# Patient Record
Sex: Female | Born: 1990 | Race: White | Hispanic: No | Marital: Single | Smoking: Former smoker
Health system: Southern US, Community
[De-identification: ages and names within clinical notes are randomized; demographics above are authoritative.]

## PROBLEM LIST (undated history)

## (undated) DIAGNOSIS — F909 Attention-deficit hyperactivity disorder, unspecified type: Secondary | ICD-10-CM

## (undated) DIAGNOSIS — B009 Herpesviral infection, unspecified: Secondary | ICD-10-CM

## (undated) DIAGNOSIS — F1990 Other psychoactive substance use, unspecified, uncomplicated: Secondary | ICD-10-CM

## (undated) HISTORY — DX: Herpesviral infection, unspecified: B00.9

## (undated) HISTORY — DX: Attention-deficit hyperactivity disorder, unspecified type: F90.9

---

## 2012-02-21 HISTORY — PX: WISDOM TOOTH EXTRACTION: SHX21

## 2019-09-11 ENCOUNTER — Ambulatory Visit: Payer: Medicaid Other | Admitting: Family Medicine

## 2019-09-11 ENCOUNTER — Other Ambulatory Visit: Payer: Self-pay

## 2019-09-11 ENCOUNTER — Encounter: Payer: Self-pay | Admitting: Family Medicine

## 2019-09-11 VITALS — BP 105/65 | HR 74 | Temp 97.5°F | Ht 63.5 in | Wt 114.4 lb

## 2019-09-11 DIAGNOSIS — R300 Dysuria: Secondary | ICD-10-CM | POA: Diagnosis not present

## 2019-09-11 DIAGNOSIS — N3001 Acute cystitis with hematuria: Secondary | ICD-10-CM | POA: Diagnosis not present

## 2019-09-11 DIAGNOSIS — F320 Major depressive disorder, single episode, mild: Secondary | ICD-10-CM

## 2019-09-11 DIAGNOSIS — Z309 Encounter for contraceptive management, unspecified: Secondary | ICD-10-CM

## 2019-09-11 DIAGNOSIS — F411 Generalized anxiety disorder: Secondary | ICD-10-CM | POA: Diagnosis not present

## 2019-09-11 HISTORY — DX: Major depressive disorder, single episode, mild: F32.0

## 2019-09-11 LAB — URINALYSIS, COMPLETE
Bilirubin, UA: NEGATIVE
Glucose, UA: NEGATIVE
Ketones, UA: NEGATIVE
Nitrite, UA: NEGATIVE
Protein,UA: NEGATIVE
Specific Gravity, UA: 1.025 (ref 1.005–1.030)
Urobilinogen, Ur: 1 mg/dL (ref 0.2–1.0)
pH, UA: 6 (ref 5.0–7.5)

## 2019-09-11 LAB — MICROSCOPIC EXAMINATION: Renal Epithel, UA: NONE SEEN /hpf

## 2019-09-11 MED ORDER — ESCITALOPRAM OXALATE 10 MG PO TABS: 10.0000 mg | ORAL_TABLET | Freq: Every day | ORAL | 2 refills | Status: DC

## 2019-09-11 MED ORDER — NITROFURANTOIN MONOHYD MACRO 100 MG PO CAPS: 100.0000 mg | ORAL_CAPSULE | Freq: Two times a day (BID) | ORAL | 0 refills | Status: AC

## 2019-09-11 NOTE — Progress Notes (Signed)
New Patient Office Visit  Assessment & Plan:  1. Generalized anxiety disorder - Uncontrolled. Started patient on Lexapro 10 mg QD. Referred for counseling.  - escitalopram (LEXAPRO) 10 MG tablet; Take 1 tablet (10 mg total) by mouth daily.  Dispense: 30 tablet; Refill: 2  2. Current mild episode of major depressive disorder without prior episode (HCC) - Uncontrolled. Started patient on Lexapro 10 mg QD. Referred for counseling.  - escitalopram (LEXAPRO) 10 MG tablet; Take 1 tablet (10 mg total) by mouth daily.  Dispense: 30 tablet; Refill: 2  3. Dysuria - Urinalysis, Complete - Urine dipstick shows positive for RBC's and positive for leukocytes.  Micro exam: 6-10 WBC's per HPF, 3-10 RBC's per HPF and many bacteria. - Microscopic Examination  4. Acute cystitis with hematuria - Urine Culture - nitrofurantoin, macrocrystal-monohydrate, (MACROBID) 100 MG capsule; Take 1 capsule (100 mg total) by mouth 2 (two) times daily for 5 days. 1 po BId  Dispense: 10 capsule; Refill: 0  5. Encounter for contraceptive management, unspecified type - Patient would like Nexplanon. Appointment set up with MD in office to discuss.    Follow-up: Return in 6 weeks (on 10/23/2019) for annual physical & f/u mood.   Deliah Boston, MSN, APRN, FNP-C [facility]  Subjective:  Patient ID: Gabrielle Black, female    DOB: 01-Apr-1990  Age: 29 y.o. MRN: [id number]  Patient Care Team: Gwenlyn Fudge, FNP as PCP - General (Family Medicine)  CC:  Chief Complaint  Patient presents with  . New Patient (Initial Visit)    States just went to [facility].  . Establish Care  . Dysuria    x 1 month  . lower abd pain    Patient states that she has been having lower abd pain x 2 years on and off.  . Depression    x 1 month     HPI Gabrielle Black presents to establish care.   Patient feels she is having trouble with anxiety and depression. She has never been on medication to treat. Never completed any  counseling.   Depression screen [facility] 2/9 09/11/2019  Decreased Interest 2  Down, Depressed, Hopeless 1  PHQ - 2 Score 3  Altered sleeping 0  Tired, decreased energy 2  Change in appetite 0  Feeling bad or failure about yourself  0  Trouble concentrating 3  Moving slowly or fidgety/restless 2  Suicidal thoughts 1  PHQ-9 Score 11    GAD 7 : Generalized Anxiety Score 09/11/2019  Nervous, Anxious, on Edge 2  Control/stop worrying 1  Worry too much - different things 3  Trouble relaxing 1  Restless 2  Easily annoyed or irritable 3  Afraid - awful might happen 0  Total GAD 7 Score 12    Urinary Tract Infection: Patient complains of dysuria and pain in the lower abdomen.  She has had symptoms for greater than 1 month. Patient also complains of back pain. Patient denies fever.   Patient also wants to discuss birth control options. She does not wish to have oral pills, IUD, or vaginal ring. She has done depo shot in the past. She has not had any birth control in the past 10 years.   Review of Systems  Constitutional: Negative for chills, fever, malaise/fatigue and weight loss.  HENT: Negative for congestion, ear discharge, ear pain, nosebleeds, sinus pain, sore throat and tinnitus.   Eyes: Negative for blurred vision, double vision, pain, discharge and redness.  Respiratory: Negative for cough, shortness  of breath and wheezing.   Cardiovascular: Negative for chest pain, palpitations and leg swelling.  Gastrointestinal: Positive for abdominal pain. Negative for constipation, diarrhea, heartburn, nausea and vomiting.  Genitourinary: Negative for dysuria, frequency and urgency.  Musculoskeletal: Positive for back pain. Negative for myalgias.  Skin: Negative for rash.  Neurological: Negative for dizziness, seizures, weakness and headaches.  Psychiatric/Behavioral: Positive for depression. Negative for substance abuse and suicidal ideas. The patient is nervous/anxious.    No current  outpatient medications on file.  No Known Allergies  Past Medical History:  Diagnosis Date  . ADHD   . HSV-2 (herpes simplex virus 2) infection     History reviewed. No pertinent surgical history.  Family History  Adopted: Yes  Problem Relation Age of Onset  . Hypertension Maternal Uncle   . Hyperlipidemia Maternal Uncle     Social History   Socioeconomic History  . Marital status: Single    Spouse name: Not on file  . Number of children: Not on file  . Years of education: Not on file  . Highest education level: Not on file  Occupational History  . Not on file  Tobacco Use  . Smoking status: Current Every Day Smoker    Packs/day: 0.50    Types: Cigarettes  . Smokeless tobacco: Never Used  Vaping Use  . Vaping Use: Never used  Substance and Sexual Activity  . Alcohol use: Yes    Comment: occ  . Drug use: Never  . Sexual activity: Yes    Birth control/protection: None  Other Topics Concern  . Not on file  Social History Narrative  . Not on file   Social Determinants of Health   Financial Resource Strain:   . Difficulty of Paying Living Expenses:   Food Insecurity:   . Worried About Programme researcher, broadcasting/film/video in the Last Year:   . Barista in the Last Year:   Transportation Needs:   . Freight forwarder (Medical):   Marland Kitchen Lack of Transportation (Non-Medical):   Physical Activity:   . Days of Exercise per Week:   . Minutes of Exercise per Session:   Stress:   . Feeling of Stress :   Social Connections:   . Frequency of Communication with Friends and Family:   . Frequency of Social Gatherings with Friends and Family:   . Attends Religious Services:   . Active Member of Clubs or Organizations:   . Attends Banker Meetings:   Marland Kitchen Marital Status:   Intimate Partner Violence:   . Fear of Current or Ex-Partner:   . Emotionally Abused:   Marland Kitchen Physically Abused:   . Sexually Abused:     Objective:   Today's Vitals: BP 105/65   Pulse 74    Temp (!) 97.5 F (36.4 C) (Temporal)   Ht 5' 3.5" (1.613 m)   Wt 114 lb 6.4 oz (51.9 kg)   LMP 08/28/2019   BMI 19.95 kg/m   Physical Exam Vitals reviewed.  Constitutional:      General: She is not in acute distress.    Appearance: Normal appearance. She is underweight. She is not ill-appearing, toxic-appearing or diaphoretic.  HENT:     Head: Normocephalic and atraumatic.  Eyes:     General: No scleral icterus.       Right eye: No discharge.        Left eye: No discharge.     Conjunctiva/sclera: Conjunctivae normal.  Cardiovascular:     Rate and  Rhythm: Normal rate and regular rhythm.     Heart sounds: Normal heart sounds. No murmur heard.  No friction rub. No gallop.   Pulmonary:     Effort: Pulmonary effort is normal. No respiratory distress.     Breath sounds: Normal breath sounds. No stridor. No wheezing, rhonchi or rales.  Musculoskeletal:        General: Normal range of motion.     Cervical back: Normal range of motion.  Skin:    General: Skin is warm and dry.     Capillary Refill: Capillary refill takes less than 2 seconds.  Neurological:     General: No focal deficit present.     Mental Status: She is alert and oriented to person, place, and time. Mental status is at baseline.  Psychiatric:        Mood and Affect: Mood normal.        Behavior: Behavior normal.        Thought Content: Thought content normal.        Judgment: Judgment normal.

## 2019-09-13 ENCOUNTER — Encounter: Payer: Self-pay | Admitting: Family Medicine

## 2019-09-13 LAB — URINE CULTURE

## 2019-09-29 ENCOUNTER — Ambulatory Visit: Payer: Medicaid Other | Admitting: Family Medicine

## 2019-09-30 ENCOUNTER — Encounter: Payer: Self-pay | Admitting: Family Medicine

## 2019-10-23 ENCOUNTER — Other Ambulatory Visit: Payer: Self-pay

## 2019-10-23 ENCOUNTER — Ambulatory Visit: Payer: Medicaid Other | Admitting: Family Medicine

## 2019-10-23 ENCOUNTER — Encounter: Payer: Self-pay | Admitting: Family Medicine

## 2019-10-23 ENCOUNTER — Other Ambulatory Visit (HOSPITAL_COMMUNITY)
Admission: RE | Admit: 2019-10-23 | Discharge: 2019-10-23 | Disposition: A | Discharge status: Home / Self Care | Payer: Medicaid Other | Source: Ambulatory Visit | Attending: Family Medicine | Admitting: Family Medicine

## 2019-10-23 VITALS — BP 116/74 | HR 98 | Temp 98.2°F | Ht 63.5 in | Wt 118.2 lb

## 2019-10-23 DIAGNOSIS — Z114 Encounter for screening for human immunodeficiency virus [HIV]: Secondary | ICD-10-CM

## 2019-10-23 DIAGNOSIS — Z0001 Encounter for general adult medical examination with abnormal findings: Secondary | ICD-10-CM

## 2019-10-23 DIAGNOSIS — F320 Major depressive disorder, single episode, mild: Secondary | ICD-10-CM

## 2019-10-23 DIAGNOSIS — Z113 Encounter for screening for infections with a predominantly sexual mode of transmission: Secondary | ICD-10-CM | POA: Diagnosis present

## 2019-10-23 DIAGNOSIS — Z124 Encounter for screening for malignant neoplasm of cervix: Secondary | ICD-10-CM

## 2019-10-23 DIAGNOSIS — F411 Generalized anxiety disorder: Secondary | ICD-10-CM | POA: Diagnosis not present

## 2019-10-23 DIAGNOSIS — Z0000 Encounter for general adult medical examination without abnormal findings: Secondary | ICD-10-CM

## 2019-10-23 DIAGNOSIS — Z1159 Encounter for screening for other viral diseases: Secondary | ICD-10-CM | POA: Diagnosis not present

## 2019-10-23 NOTE — Progress Notes (Signed)
Assessment & Plan:  1. Well adult exam - Preventive health education provided. Patient declined TDAP, influenza, and COVID-19 vaccines.  - CBC with Differential/Platelet - CMP14+EGFR - Lipid panel  2-3. Generalized anxiety disorder/Current mild episode of major depressive disorder without prior episode ([place]) - Uncontrolled. Patient declines medication. Encouraged to go to [facility] urgent care. Patient has things going on that she does not want disclosed in her medical record.   4. Encounter for hepatitis C screening test for low risk patient - Hepatitis C antibody  5. Encounter for screening for human immunodeficiency virus (HIV) - HIV Antibody (routine testing w rflx)  6. Screening for cervical cancer - Cytology - PAP  7. Routine screening for STI (sexually transmitted infection) - Cytology - PAP   Follow-up: Return in about 1 year (around 10/22/2020) for annual physical.   Hendricks Limes, MSN, APRN, FNP-C [facility]  Subjective:  Patient ID: Gabrielle Black, female    DOB: 1990/10/17  Age: 29 y.o. MRN: [id number]  Patient Care Team: Loman Brooklyn, FNP as PCP - General (Family Medicine)   CC:  Chief Complaint  Patient presents with   Gynecologic Exam    Patient would like help to stop smoking.    Ear Pain    Patient states she thinks she has something in her bilateral ears.   Anxiety    Patient states she stopped her lexapro due to vomiting.    HPI Gabrielle Black presents for her annual physical.   Immunizations: Flu Vaccine: declined Tdap Vaccine: declined  COVID-19 Vaccine: declined  Patient was started on Lexapro ~6 weeks ago for anxiety and depression. She reports she only took the medication twice and both times it made her vomit so she quit taking it. She does not wish to have another medication. She was also referred for therapy and states she has an appointment coming up but is going to have to reschedule for  personal reasons.   Depression screen [facility] 2/9 10/23/2019 09/11/2019  Decreased Interest 2 2  Down, Depressed, Hopeless 3 1  PHQ - 2 Score 5 3  Altered sleeping 0 0  Tired, decreased energy 1 2  Change in appetite 2 0  Feeling bad or failure about yourself  1 0  Trouble concentrating 0 3  Moving slowly or fidgety/restless 3 2  Suicidal thoughts 1 1  PHQ-9 Score 13 11   Patient has had thoughts of harming herself. She does not have a plan and states she would never be able to actually do it as she knows she needs to be here for her family.   GAD 7 : Generalized Anxiety Score 10/23/2019 09/11/2019  Nervous, Anxious, on Edge 1 2  Control/stop worrying 2 1  Worry too much - different things 3 3  Trouble relaxing 1 1  Restless 2 2  Easily annoyed or irritable 3 3  Afraid - awful might happen 0 0  Total GAD 7 Score 12 12    Patient feels she has something in her ears and would like them checked.    Review of Systems  Constitutional: Negative for chills, fever, malaise/fatigue and weight loss.  HENT: Negative for congestion, ear discharge, ear pain, nosebleeds, sinus pain, sore throat and tinnitus.   Eyes: Negative for blurred vision, double vision, pain, discharge and redness.  Respiratory: Negative for cough, shortness of breath and wheezing.   Cardiovascular: Negative for chest pain, palpitations and leg swelling.  Gastrointestinal: Negative for abdominal  pain, constipation, diarrhea, heartburn, nausea and vomiting.  Genitourinary: Negative for dysuria, frequency and urgency.  Musculoskeletal: Negative for myalgias.  Skin: Negative for rash.  Neurological: Negative for dizziness, seizures, weakness and headaches.  Psychiatric/Behavioral: Positive for depression and suicidal ideas. Negative for substance abuse. The patient is nervous/anxious.     No current outpatient medications on file.  No Known Allergies  Past Medical History:  Diagnosis Date   ADHD    HSV-2 (herpes  simplex virus 2) infection     History reviewed. No pertinent surgical history.  Family History  Adopted: Yes  Problem Relation Age of Onset   Hypertension Maternal Uncle    Hyperlipidemia Maternal Uncle     Social History   Socioeconomic History   Marital status: Single    Spouse name: Not on file   Number of children: Not on file   Years of education: Not on file   Highest education level: Not on file  Occupational History   Not on file  Tobacco Use   Smoking status: Current Every Day Smoker    Packs/day: 0.50    Types: Cigarettes   Smokeless tobacco: Never Used  Vaping Use   Vaping Use: Never used  Substance and Sexual Activity   Alcohol use: Yes    Comment: occ   Drug use: Never   Sexual activity: Yes    Birth control/protection: None  Other Topics Concern   Not on file  Social History Narrative   Not on file   Social Determinants of Health   Financial Resource Strain:    Difficulty of Paying Living Expenses: Not on file  Food Insecurity:    Worried About Charity fundraiser in the Last Year: Not on file   [employer] of Food in the Last Year: Not on file  Transportation Needs:    Lack of Transportation (Medical): Not on file   Lack of Transportation (Non-Medical): Not on file  Physical Activity:    Days of Exercise per Week: Not on file   Minutes of Exercise per Session: Not on file  Stress:    Feeling of Stress : Not on file  Social Connections:    Frequency of Communication with Friends and Family: Not on file   Frequency of Social Gatherings with Friends and Family: Not on file   Attends Religious Services: Not on file   Active Member of Clubs or Organizations: Not on file   Attends Archivist Meetings: Not on file   Marital Status: Not on file  Intimate Partner Violence:    Fear of Current or Ex-Partner: Not on file   Emotionally Abused: Not on file   Physically Abused: Not on file   Sexually Abused: Not  on file      Objective:    BP 116/74    Pulse 98    Temp 98.2 F (36.8 C) (Temporal)    Ht 5' 3.5" (1.613 m)    Wt 118 lb 3.2 oz (53.6 kg)    LMP 09/22/2019 (Approximate)    SpO2 98%    BMI 20.61 kg/m   Wt Readings from Last 3 Encounters:  10/23/19 118 lb 3.2 oz (53.6 kg)  09/11/19 114 lb 6.4 oz (51.9 kg)    Physical Exam Vitals reviewed. Exam conducted with a chaperone present.  Constitutional:      General: She is not in acute distress.    Appearance: Normal appearance. She is overweight. She is not ill-appearing, toxic-appearing or diaphoretic.  HENT:  Head: Normocephalic and atraumatic.     Right Ear: Tympanic membrane, ear canal and external ear normal. There is no impacted cerumen.     Left Ear: Tympanic membrane, ear canal and external ear normal. There is no impacted cerumen.     Nose: Nose normal. No congestion or rhinorrhea.     Mouth/Throat:     Mouth: Mucous membranes are moist.     Pharynx: Oropharynx is clear. No oropharyngeal exudate or posterior oropharyngeal erythema.  Eyes:     General: No scleral icterus.       Right eye: No discharge.        Left eye: No discharge.     Conjunctiva/sclera: Conjunctivae normal.     Pupils: Pupils are equal, round, and reactive to light.  Cardiovascular:     Rate and Rhythm: Normal rate and regular rhythm.     Heart sounds: Normal heart sounds. No murmur heard.  No friction rub. No gallop.   Pulmonary:     Effort: Pulmonary effort is normal. No respiratory distress.     Breath sounds: Normal breath sounds. No stridor. No wheezing, rhonchi or rales.  Chest:     Breasts: Breasts are symmetrical.        Right: Normal.        Left: Normal.  Abdominal:     General: Abdomen is flat. Bowel sounds are normal. There is no distension.     Palpations: Abdomen is soft. There is no mass.     Tenderness: There is no abdominal tenderness. There is no guarding or rebound.     Hernia: No hernia is present.  Genitourinary:     Pubic Area: No rash or pubic lice.      Labia:        Right: No rash, tenderness, lesion or injury.        Left: No rash, tenderness, lesion or injury.      Vagina: Normal.     Cervix: Discharge (thin clear) present. No cervical motion tenderness, friability, lesion, erythema, cervical bleeding or eversion.     Uterus: Normal.      Adnexa: Right adnexa normal and left adnexa normal.  Musculoskeletal:        General: Normal range of motion.     Cervical back: Normal range of motion and neck supple. No rigidity. No muscular tenderness.  Lymphadenopathy:     Cervical: No cervical adenopathy.     Upper Body:     Right upper body: No supraclavicular, axillary or pectoral adenopathy.     Left upper body: No supraclavicular, axillary or pectoral adenopathy.  Skin:    General: Skin is warm and dry.     Capillary Refill: Capillary refill takes less than 2 seconds.  Neurological:     General: No focal deficit present.     Mental Status: She is alert and oriented to person, place, and time. Mental status is at baseline.  Psychiatric:        Attention and Perception: Attention and perception normal.        Mood and Affect: Mood is anxious and depressed. Affect is tearful.        Speech: Speech normal.        Behavior: Behavior normal.        Thought Content: Thought content normal.        Cognition and Memory: Cognition and memory normal.        Judgment: Judgment normal.     No results found for:  TSH No results found for: WBC, HGB, HCT, MCV, PLT No results found for: NA, K, CHLORIDE, CO2, GLUCOSE, BUN, CREATININE, BILITOT, ALKPHOS, AST, ALT, PROT, ALBUMIN, CALCIUM, ANIONGAP, EGFR, GFR No results found for: CHOL No results found for: HDL No results found for: LDLCALC No results found for: TRIG No results found for: CHOLHDL No results found for: HGBA1C

## 2019-10-23 NOTE — Patient Instructions (Addendum)
[facility] Phone: [phone] Address: [address], [place] [address]   Preventive Care 28-28 Years Old, Female Preventive care refers to visits with your health care provider and lifestyle choices that can promote health and wellness. This includes:  A yearly physical exam. This may also be called an annual well check.  Regular dental visits and eye exams.  Immunizations.  Screening for certain conditions.  Healthy lifestyle choices, such as eating a healthy diet, getting regular exercise, not using drugs or products that contain nicotine and tobacco, and limiting alcohol use. What can I expect for my preventive care visit? Physical exam Your health care provider will check your:  Height and weight. This may be used to calculate body mass index (BMI), which tells if you are at a healthy weight.  Heart rate and blood pressure.  Skin for abnormal spots. Counseling Your health care provider may ask you questions about your:  Alcohol, tobacco, and drug use.  Emotional well-being.  Home and relationship well-being.  Sexual activity.  Eating habits.  Work and work Statistician.  Method of birth control.  Menstrual cycle.  Pregnancy history. What immunizations do I need?  Influenza (flu) vaccine  This is recommended every year. Tetanus, diphtheria, and pertussis (Tdap) vaccine  You may need a Td booster every 10 years. Varicella (chickenpox) vaccine  You may need this if you have not been vaccinated. Human papillomavirus (HPV) vaccine  If recommended by your health care provider, you may need three doses over 6 months. Measles, mumps, and rubella (MMR) vaccine  You may need at least one dose of MMR. You may also need a second dose. Meningococcal conjugate (MenACWY) vaccine  One dose is recommended if you are age 19-21 years and a first-year college student living in a residence hall, or if you have one of several medical  conditions. You may also need additional booster doses. Pneumococcal conjugate (PCV13) vaccine  You may need this if you have certain conditions and were not previously vaccinated. Pneumococcal polysaccharide (PPSV23) vaccine  You may need one or two doses if you smoke cigarettes or if you have certain conditions. Hepatitis A vaccine  You may need this if you have certain conditions or if you travel or work in places where you may be exposed to hepatitis A. Hepatitis B vaccine  You may need this if you have certain conditions or if you travel or work in places where you may be exposed to hepatitis B. Haemophilus influenzae type b (Hib) vaccine  You may need this if you have certain conditions. You may receive vaccines as individual doses or as more than one vaccine together in one shot (combination vaccines). Talk with your health care provider about the risks and benefits of combination vaccines. What tests do I need?  Blood tests  Lipid and cholesterol levels. These may be checked every 5 years starting at age 49.  Hepatitis C test.  Hepatitis B test. Screening  Diabetes screening. This is done by checking your blood sugar (glucose) after you have not eaten for a while (fasting).  Sexually transmitted disease (STD) testing.  BRCA-related cancer screening. This may be done if you have a family history of breast, ovarian, tubal, or peritoneal cancers.  Pelvic exam and Pap test. This may be done every 3 years starting at age 45. Starting at age 21, this may be done every 5 years if you have a Pap test in combination with an HPV test. Talk with your  health care provider about your test results, treatment options, and if necessary, the need for more tests. Follow these instructions at home: Eating and drinking   Eat a diet that includes fresh fruits and vegetables, whole grains, lean protein, and low-fat dairy.  Take vitamin and mineral supplements as recommended by your health  care provider.  Do not drink alcohol if: ? Your health care provider tells you not to drink. ? You are pregnant, may be pregnant, or are planning to become pregnant.  If you drink alcohol: ? Limit how much you have to 0-1 drink a day. ? Be aware of how much alcohol is in your drink. In the [place] one drink equals one 12 oz bottle of beer (355 mL), one 5 oz glass of wine (148 mL), or one 1 oz glass of hard liquor (44 mL). Lifestyle  Take daily care of your teeth and gums.  Stay active. Exercise for at least 30 minutes on 5 or more days each week.  Do not use any products that contain nicotine or tobacco, such as cigarettes, e-cigarettes, and chewing tobacco. If you need help quitting, ask your health care provider.  If you are sexually active, practice safe sex. Use a condom or other form of birth control (contraception) in order to prevent pregnancy and STIs (sexually transmitted infections). If you plan to become pregnant, see your health care provider for a preconception visit. What's next?  Visit your health care provider once a year for a well check visit.  Ask your health care provider how often you should have your eyes and teeth checked.  Stay up to date on all vaccines. This information is not intended to replace advice given to you by your health care provider. Make sure you discuss any questions you have with your health care provider. Document Revised: 10/18/2017 Document Reviewed: 10/18/2017 Elsevier Patient Education  2020 [employer].

## 2019-10-24 LAB — CBC WITH DIFFERENTIAL/PLATELET
Basophils Absolute: 0 10*3/uL (ref 0.0–0.2)
Basos: 1 %
EOS (ABSOLUTE): 0.6 10*3/uL — ABNORMAL HIGH (ref 0.0–0.4)
Eos: 9 %
Hematocrit: 34.3 % (ref 34.0–46.6)
Hemoglobin: 11.6 g/dL (ref 11.1–15.9)
Immature Grans (Abs): 0 10*3/uL (ref 0.0–0.1)
Immature Granulocytes: 0 %
Lymphocytes Absolute: 2.2 10*3/uL (ref 0.7–3.1)
Lymphs: 31 %
MCH: 32.5 pg (ref 26.6–33.0)
MCHC: 33.8 g/dL (ref 31.5–35.7)
MCV: 96 fL (ref 79–97)
Monocytes Absolute: 0.6 10*3/uL (ref 0.1–0.9)
Monocytes: 9 %
Neutrophils Absolute: 3.5 10*3/uL (ref 1.4–7.0)
Neutrophils: 50 %
Platelets: 239 10*3/uL (ref 150–450)
RBC: 3.57 x10E6/uL — ABNORMAL LOW (ref 3.77–5.28)
RDW: 12.7 % (ref 11.7–15.4)
WBC: 6.9 10*3/uL (ref 3.4–10.8)

## 2019-10-24 LAB — CYTOLOGY - PAP
Chlamydia: NEGATIVE
Comment: NEGATIVE
Comment: NEGATIVE
Comment: NORMAL
Diagnosis: NEGATIVE
Neisseria Gonorrhea: NEGATIVE
Trichomonas: NEGATIVE

## 2019-10-24 LAB — HIV ANTIBODY (ROUTINE TESTING W REFLEX): HIV Screen 4th Generation wRfx: NONREACTIVE

## 2019-10-24 LAB — CMP14+EGFR
ALT: 12 IU/L (ref 0–32)
AST: 11 IU/L (ref 0–40)
Albumin/Globulin Ratio: 2.2 (ref 1.2–2.2)
Albumin: 4.3 g/dL (ref 3.9–5.0)
Alkaline Phosphatase: 48 IU/L (ref 48–121)
BUN/Creatinine Ratio: 7 — ABNORMAL LOW (ref 9–23)
BUN: 4 mg/dL — ABNORMAL LOW (ref 6–20)
Bilirubin Total: 0.4 mg/dL (ref 0.0–1.2)
CO2: 26 mmol/L (ref 20–29)
Calcium: 8.8 mg/dL (ref 8.7–10.2)
Chloride: 104 mmol/L (ref 96–106)
Creatinine, Ser: 0.61 mg/dL (ref 0.57–1.00)
GFR calc Af Amer: 144 mL/min/{1.73_m2} (ref >59)
GFR calc non Af Amer: 125 mL/min/{1.73_m2} (ref >59)
Globulin, Total: 2 g/dL (ref 1.5–4.5)
Glucose: 70 mg/dL (ref 65–99)
Potassium: 3.9 mmol/L (ref 3.5–5.2)
Sodium: 140 mmol/L (ref 134–144)
Total Protein: 6.3 g/dL (ref 6.0–8.5)

## 2019-10-24 LAB — LIPID PANEL
Chol/HDL Ratio: 2.8 ratio (ref 0.0–4.4)
Cholesterol, Total: 125 mg/dL (ref 100–199)
HDL: 44 mg/dL (ref >39)
LDL Chol Calc (NIH): 70 mg/dL (ref 0–99)
Triglycerides: 46 mg/dL (ref 0–149)
VLDL Cholesterol Cal: 11 mg/dL (ref 5–40)

## 2019-10-24 LAB — HEPATITIS C ANTIBODY: Hep C Virus Ab: <0.1 s/co ratio (ref 0.0–0.9)

## 2019-10-26 ENCOUNTER — Encounter: Payer: Self-pay | Admitting: Family Medicine

## 2019-10-28 ENCOUNTER — Ambulatory Visit (HOSPITAL_COMMUNITY): Payer: Self-pay | Admitting: Licensed Clinical Social Worker

## 2019-10-28 ENCOUNTER — Ambulatory Visit (HOSPITAL_COMMUNITY)
Admission: EM | Admit: 2019-10-28 | Discharge: 2019-10-28 | Disposition: A | Discharge status: Home / Self Care | Payer: Medicaid Other

## 2019-11-05 ENCOUNTER — Other Ambulatory Visit: Payer: Self-pay

## 2019-11-05 ENCOUNTER — Encounter: Payer: Self-pay | Admitting: Family Medicine

## 2019-11-05 ENCOUNTER — Ambulatory Visit: Payer: Medicaid Other | Admitting: Family Medicine

## 2019-11-05 VITALS — BP 103/64 | HR 93 | Temp 98.0°F | Ht 63.5 in | Wt 114.0 lb

## 2019-11-05 DIAGNOSIS — Z3046 Encounter for surveillance of implantable subdermal contraceptive: Secondary | ICD-10-CM

## 2019-11-05 DIAGNOSIS — O039 Complete or unspecified spontaneous abortion without complication: Secondary | ICD-10-CM | POA: Diagnosis not present

## 2019-11-05 LAB — PREGNANCY, URINE: Preg Test, Ur: POSITIVE — AB

## 2019-11-05 NOTE — Progress Notes (Signed)
BP 103/64   Pulse 93   Temp 98 F (36.7 C)   Ht 5' 3.5" (1.613 m)   Wt 114 lb (51.7 kg)   SpO2 97%   BMI 19.88 kg/m    Subjective:   Patient ID: Gabrielle Black, female    DOB: [birth year], 29 y.o.   MRN: [id number]  HPI: Gabrielle Black is a 29 y.o. female presenting on 11/05/2019 for Discuss Nexplanon   HPI Patient is coming in for birth control counseling to discuss Nexplanon.  She has had Depo before and has done well.  She just recently had a miscarriage and thinks she lost that 2 weeks ago.  She stopped bleeding 3 days ago and denies any abdominal pain or cramping and feels like things are going well.  She has not been sexually active since that miscarriage.  She does want birth control.  Relevant past medical, surgical, family and social history reviewed and updated as indicated. Interim medical history since our last visit reviewed. Allergies and medications reviewed and updated.  Review of Systems  Constitutional: Negative for chills and fever.  Eyes: Negative for visual disturbance.  Respiratory: Negative for chest tightness and shortness of breath.   Cardiovascular: Negative for chest pain and leg swelling.  Genitourinary: Positive for menstrual problem and pelvic pain. Negative for difficulty urinating, dysuria and urgency.  Musculoskeletal: Negative for back pain and gait problem.  Skin: Negative for rash.  Neurological: Negative for light-headedness and headaches.  Psychiatric/Behavioral: Negative for agitation and behavioral problems.  All other systems reviewed and are negative.   Per HPI unless specifically indicated above   Allergies as of 11/05/2019   No Known Allergies     Medication List    as of November 05, 2019 12:38 PM   You have not been prescribed any medications.      Objective:   BP 103/64   Pulse 93   Temp 98 F (36.7 C)   Ht 5' 3.5" (1.613 m)   Wt 114 lb (51.7 kg)   SpO2 97%   BMI 19.88 kg/m   Wt Readings from Last 3 Encounters:    11/05/19 114 lb (51.7 kg)  10/23/19 118 lb 3.2 oz (53.6 kg)  09/11/19 114 lb 6.4 oz (51.9 kg)    Physical Exam Vitals and nursing note reviewed.  Constitutional:      General: She is not in acute distress.    Appearance: She is well-developed. She is not diaphoretic.  Eyes:     Conjunctiva/sclera: Conjunctivae normal.  Skin:    General: Skin is warm and dry.     Findings: No rash.  Neurological:     Mental Status: She is alert and oriented to person, place, and time.     Coordination: Coordination normal.       Assessment & Plan:   Problem List Items Addressed This Visit    None    Visit Diagnoses    Encounter for surveillance of implantable subdermal contraceptive    -  Primary   Relevant Orders   Pregnancy, urine (Completed)   Beta hCG quant (ref lab)   Miscarriage       Relevant Orders   Beta hCG quant (ref lab)    Patient came back urine pregnancy positive, will do beta-hCG and trended down, likely from miscarriage, she will return in 2 weeks for Nexplanon.  Follow up plan: Return if symptoms worsen or fail to improve, for 2-week recheck.  Counseling provided for all of the vaccine  components Orders Placed This Encounter  Procedures  . Pregnancy, urine  . Beta hCG quant (ref lab)    Arville Care, MD [facility] 11/05/2019, 12:38 PM

## 2019-11-06 LAB — BETA HCG QUANT (REF LAB): hCG Quant: 163 m[IU]/mL

## 2019-11-21 ENCOUNTER — Ambulatory Visit: Payer: Medicaid Other | Admitting: Family Medicine

## 2019-11-21 ENCOUNTER — Telehealth: Payer: Self-pay

## 2019-11-21 NOTE — Telephone Encounter (Signed)
Gabrielle Black, forwarding this message to you so you can call patient to schedule appointment.  I know Dr. Louanne Skye likes them to come in at certain times during their cycle.

## 2019-11-21 NOTE — Telephone Encounter (Signed)
Pt r/s to 12/11/19 for Nexplanon insertion. Pt will come by the office on 11/27/19 for urine pregnancy. She knows to be abstinent until her appt on 10/21.

## 2019-11-21 NOTE — Telephone Encounter (Signed)
Pt called to cancel her Nexplanon Insertion apt. She is Gabrielle Black pt but was scheduled to see Dettinger. She is aware a nurse will call back to reschedule apt.

## 2019-12-04 ENCOUNTER — Ambulatory Visit (HOSPITAL_COMMUNITY): Payer: Self-pay | Admitting: Licensed Clinical Social Worker

## 2019-12-11 ENCOUNTER — Encounter: Payer: Self-pay | Admitting: Family Medicine

## 2019-12-11 ENCOUNTER — Ambulatory Visit: Payer: Medicaid Other | Admitting: Family Medicine

## 2019-12-11 ENCOUNTER — Other Ambulatory Visit: Payer: Self-pay

## 2019-12-11 VITALS — Ht 63.5 in | Wt 117.0 lb

## 2019-12-11 DIAGNOSIS — Z30017 Encounter for initial prescription of implantable subdermal contraceptive: Secondary | ICD-10-CM

## 2019-12-11 DIAGNOSIS — Z308 Encounter for other contraceptive management: Secondary | ICD-10-CM

## 2019-12-11 LAB — URINALYSIS, COMPLETE
Bilirubin, UA: NEGATIVE
Glucose, UA: NEGATIVE
Ketones, UA: NEGATIVE
Nitrite, UA: NEGATIVE
Protein,UA: NEGATIVE
Specific Gravity, UA: 1.015 (ref 1.005–1.030)
Urobilinogen, Ur: 0.2 mg/dL (ref 0.2–1.0)
pH, UA: 7 (ref 5.0–7.5)

## 2019-12-11 LAB — MICROSCOPIC EXAMINATION
Bacteria, UA: NONE SEEN
RBC, Urine: NONE SEEN /hpf (ref 0–2)

## 2019-12-11 LAB — PREGNANCY, URINE: Preg Test, Ur: NEGATIVE

## 2019-12-11 MED ORDER — ETONOGESTREL 68 MG ~~LOC~~ IMPL: 68.0000 mg | DRUG_IMPLANT | Freq: Once | SUBCUTANEOUS | Status: AC

## 2019-12-11 NOTE — Progress Notes (Signed)
Ht 5' 3.5" (1.613 m)   Wt 117 lb (53.1 kg)   BMI 20.40 kg/m    Subjective:   Patient ID: Gabrielle Black, female    DOB: [birth year], 29 y.o.   MRN: [id number]  HPI: Gabrielle Black is a 29 y.o. female presenting on 12/11/2019 for Nexplanon Insertion   HPI Has not had intercourse for 2 weeks, she had a.  On 10 October and has not had intercourse since before that time.  She had a miscarriage just over a month ago.  And she had a normal cycle just a couple weeks ago since that.  She has not been sexually active since that time.  She has a urine pregnancy today.  She does want a Nexplanon.  Relevant past medical, surgical, family and social history reviewed and updated as indicated. Interim medical history since our last visit reviewed. Allergies and medications reviewed and updated.  Review of Systems  Constitutional: Negative for chills and fever.  Eyes: Negative for visual disturbance.  Respiratory: Negative for chest tightness and shortness of breath.   Cardiovascular: Negative for chest pain and leg swelling.  Genitourinary: Negative for difficulty urinating, dysuria, menstrual problem and urgency.  Musculoskeletal: Negative for back pain and gait problem.  Skin: Negative for rash.  Neurological: Negative for light-headedness and headaches.  Psychiatric/Behavioral: Negative for agitation and behavioral problems.  All other systems reviewed and are negative.   Per HPI unless specifically indicated above   Allergies as of 12/11/2019   No Known Allergies     Medication List    as of December 11, 2019  3:56 PM   You have not been prescribed any medications.      Objective:   Ht 5' 3.5" (1.613 m)   Wt 117 lb (53.1 kg)   BMI 20.40 kg/m   Wt Readings from Last 3 Encounters:  12/11/19 117 lb (53.1 kg)  11/05/19 114 lb (51.7 kg)  10/23/19 118 lb 3.2 oz (53.6 kg)    Physical Exam Vitals and nursing note reviewed.  Constitutional:      General: She is not in acute  distress.    Appearance: She is well-developed. She is not diaphoretic.  Skin:    General: Skin is warm and dry.     Findings: No rash.  Neurological:     Mental Status: She is alert and oriented to person, place, and time.     Coordination: Coordination normal.  Psychiatric:        Behavior: Behavior normal.    Nexplanon insertion: Patient educated on Nexplanon birth control and its side effects and the side effects from the procedure. Patient wishes to continue. Left inner arm prepped with Betadine. 3 mL of 2% lidocaine without epinephrine used for anesthesia. Device was prepped using sterile procedure. Nexplanon was inserted using package and Company directions. Steri-Strips were applied. Bleeding was minimal and patient tolerated procedure well.   Urine Pregnancy negative  Assessment & Plan:   Problem List Items Addressed This Visit    None    Visit Diagnoses    Encounter for other contraceptive management    -  Primary   Relevant Medications   etonogestrel (NEXPLANON) implant 68 mg (Start on 12/11/2019  4:30 PM)   Other Relevant Orders   Pregnancy, urine   Urinalysis, Complete       Follow up plan: Return if symptoms worsen or fail to improve.  Counseling provided for all of the vaccine components Orders Placed This Encounter  Procedures  .  Pregnancy, urine    Arville Care, MD [facility] 12/11/2019, 3:56 PM

## 2019-12-16 ENCOUNTER — Ambulatory Visit (INDEPENDENT_AMBULATORY_CARE_PROVIDER_SITE_OTHER): Payer: Medicaid Other | Admitting: Family

## 2019-12-16 ENCOUNTER — Other Ambulatory Visit: Payer: Self-pay

## 2019-12-16 ENCOUNTER — Encounter: Payer: Self-pay | Admitting: Family

## 2019-12-16 ENCOUNTER — Other Ambulatory Visit: Payer: Self-pay | Admitting: Family

## 2019-12-16 DIAGNOSIS — R399 Unspecified symptoms and signs involving the genitourinary system: Secondary | ICD-10-CM | POA: Diagnosis not present

## 2019-12-16 DIAGNOSIS — R300 Dysuria: Secondary | ICD-10-CM

## 2019-12-16 LAB — URINALYSIS, COMPLETE
Bilirubin, UA: NEGATIVE
Glucose, UA: NEGATIVE
Ketones, UA: NEGATIVE
Nitrite, UA: NEGATIVE
Protein,UA: NEGATIVE
Specific Gravity, UA: <1.005 — ABNORMAL LOW (ref 1.005–1.030)
Urobilinogen, Ur: 0.2 mg/dL (ref 0.2–1.0)
pH, UA: 5 (ref 5.0–7.5)

## 2019-12-16 LAB — MICROSCOPIC EXAMINATION
Epithelial Cells (non renal): NONE SEEN /hpf (ref 0–10)
RBC, Urine: NONE SEEN /hpf (ref 0–2)

## 2019-12-16 MED ORDER — CEPHALEXIN 500 MG PO CAPS: 500.0000 mg | ORAL_CAPSULE | Freq: Two times a day (BID) | ORAL | 0 refills | Status: DC

## 2019-12-16 NOTE — Progress Notes (Signed)
° °  Virtual Visit via telephone Note Due to COVID-19 pandemic this visit was conducted virtually. This visit type was conducted due to national recommendations for restrictions regarding the COVID-19 Pandemic (e.g. social distancing, sheltering in place) in an effort to limit this patient's exposure and mitigate transmission in our community. All issues noted in this document were discussed and addressed.  A physical exam was not performed with this format.  I connected with Gabrielle Black on 12/16/19 at 2:28 pm  by telephone and verified that I am speaking with the correct person using two identifiers. Gabrielle Black is currently located at home and no one is currently with her during visit. The provider, Jannifer Rodney, FNP is located in their office at time of visit.  I discussed the limitations, risks, security and privacy concerns of performing an evaluation and management service by telephone and the availability of in person appointments. I also discussed with the patient that there may be a patient responsible charge related to this service. The patient expressed understanding and agreed to proceed.   History and Present Illness:  Dysuria  This is a new problem. The current episode started 1 to 4 weeks ago. The problem occurs intermittently. The problem has been gradually worsening. The quality of the pain is described as burning. The pain is at a severity of 6/10. The pain is mild. There has been no fever. Associated symptoms include flank pain, frequency and urgency. Pertinent negatives include no chills, discharge, hematuria, hesitancy, nausea or vomiting. She has tried increased fluids for the symptoms. The treatment provided no relief.      Review of Systems  Constitutional: Negative for chills.  Gastrointestinal: Negative for nausea and vomiting.  Genitourinary: Positive for dysuria, flank pain, frequency and urgency. Negative for hematuria and hesitancy.  All other systems reviewed and are  negative.    Observations/Objective: No SOB or distress noted   Assessment and Plan: 1. Dysuria - Urinalysis, Complete - Urine Culture  2. UTI symptoms Force fluids AZO over the counter X2 days RTO if symptoms worsen or do not improve  Culture pending - cephALEXin (KEFLEX) 500 MG capsule; Take 1 capsule (500 mg total) by mouth 2 (two) times daily.  Dispense: 14 capsule; Refill: 0     I discussed the assessment and treatment plan with the patient. The patient was provided an opportunity to ask questions and all were answered. The patient agreed with the plan and demonstrated an understanding of the instructions.   The patient was advised to call back or seek an in-person evaluation if the symptoms worsen or if the condition fails to improve as anticipated.  The above assessment and management plan was discussed with the patient. The patient verbalized understanding of and has agreed to the management plan. Patient is aware to call the clinic if symptoms persist or worsen. Patient is aware when to return to the clinic for a follow-up visit. Patient educated on when it is appropriate to go to the emergency department.   Time call ended:  2:39 pm   I provided 11 minutes of non-face-to-face time during this encounter.    Jannifer Rodney, FNP

## 2019-12-20 LAB — URINE CULTURE

## 2020-03-04 ENCOUNTER — Encounter: Payer: Self-pay | Admitting: Family Medicine

## 2020-03-19 ENCOUNTER — Encounter: Payer: Self-pay | Admitting: Family Medicine

## 2020-03-25 ENCOUNTER — Ambulatory Visit (INDEPENDENT_AMBULATORY_CARE_PROVIDER_SITE_OTHER): Payer: Medicaid Other | Admitting: Family Medicine

## 2020-03-25 ENCOUNTER — Other Ambulatory Visit: Payer: Self-pay

## 2020-03-25 ENCOUNTER — Ambulatory Visit (INDEPENDENT_AMBULATORY_CARE_PROVIDER_SITE_OTHER): Payer: Medicaid Other

## 2020-03-25 ENCOUNTER — Encounter: Payer: Self-pay | Admitting: Family Medicine

## 2020-03-25 VITALS — BP 106/97 | HR 82 | Temp 97.7°F | Ht 63.5 in | Wt 120.0 lb

## 2020-03-25 DIAGNOSIS — G8929 Other chronic pain: Secondary | ICD-10-CM

## 2020-03-25 DIAGNOSIS — M545 Low back pain, unspecified: Secondary | ICD-10-CM | POA: Diagnosis not present

## 2020-03-25 DIAGNOSIS — F909 Attention-deficit hyperactivity disorder, unspecified type: Secondary | ICD-10-CM | POA: Diagnosis not present

## 2020-03-25 DIAGNOSIS — M546 Pain in thoracic spine: Secondary | ICD-10-CM

## 2020-03-25 MED ORDER — MELOXICAM 7.5 MG PO TABS: 7.5000 mg | ORAL_TABLET | Freq: Every day | ORAL | 2 refills | Status: DC

## 2020-03-25 MED ORDER — METHOCARBAMOL 500 MG PO TABS: 500.0000 mg | ORAL_TABLET | Freq: Three times a day (TID) | ORAL | 2 refills | Status: DC | PRN

## 2020-03-25 MED ORDER — ATOMOXETINE HCL 40 MG PO CAPS: 40.0000 mg | ORAL_CAPSULE | Freq: Every day | ORAL | 2 refills | Status: DC

## 2020-03-25 NOTE — Progress Notes (Signed)
Assessment & Plan:  1. Chronic midline low back pain without sciatica - Encouraged use of heating pad, muscle rub, NSAID and muscle relaxer.  Patient will start physical therapy and follow-up in 6 weeks. - Ambulatory referral to Physical Therapy - DG Lumbar Spine 2-3 Views - meloxicam (MOBIC) 7.5 MG tablet; Take 1 tablet (7.5 mg total) by mouth daily.  Dispense: 30 tablet; Refill: 2 - methocarbamol (ROBAXIN) 500 MG tablet; Take 1 tablet (500 mg total) by mouth every 8 (eight) hours as needed for muscle spasms.  Dispense: 30 tablet; Refill: 2  2. Acute bilateral thoracic back pain - Encouraged use of heating pad, muscle rub, NSAID and muscle relaxer.  Patient will start physical therapy and follow-up in 6 weeks. - Ambulatory referral to Physical Therapy - DG Thoracic Spine 2 View - meloxicam (MOBIC) 7.5 MG tablet; Take 1 tablet (7.5 mg total) by mouth daily.  Dispense: 30 tablet; Refill: 2 - methocarbamol (ROBAXIN) 500 MG tablet; Take 1 tablet (500 mg total) by mouth every 8 (eight) hours as needed for muscle spasms.  Dispense: 30 tablet; Refill: 2  3. Attention deficit hyperactivity disorder (ADHD), unspecified ADHD type - Uncontrolled.  Patient would like to go ahead and start Strattera while she is waiting to get an appointment with psychiatry. - atomoxetine (STRATTERA) 40 MG capsule; Take 1 capsule (40 mg total) by mouth daily.  Dispense: 30 capsule; Refill: 2 - Ambulatory referral to Psychiatry   Return in about 6 weeks (around 05/06/2020) for back pain & ADHD.  Deliah Boston, MSN, APRN, FNP-C [facility]  Subjective:    Patient ID: Gabrielle Black, female    DOB: [birth year], 30 y.o.   MRN: [id number]  Patient Care Team: Gwenlyn Fudge, FNP as PCP - General (Family Medicine)   Chief Complaint:  Chief Complaint  Patient presents with  . Back Pain    Upper and lower- chronic has gotten worse  . ADHD    Back on medication- pt has been off for 10 years     HPI: Gabrielle Black is a 30 y.o. female presenting on 03/25/2020 for Back Pain (Upper and lower- chronic has gotten worse) and ADHD (Back on medication- pt has been off for 10 years)  Back pain: Patient reports she has had pain in her lower back that she describes as an aching and rates 8 out of 10 on average for probably the last 15 years.  She reports it takes her at least an hour to get out of the bed in the mornings due to the pain.  It is worse with bending.  Heating pad is somewhat helpful.  Tylenol and ibuprofen do not help.  She has never tried any prescription medications.  She has never had any x-rays or done physical therapy.  She also recently started having pain in her upper back that she describes more as a tense pain that she rates a 4.5 out of 10.  ADHD: Patient reports she was diagnosed by her pediatrician with ADHD when she was 30 or 30 years of age.  She has been off of medication for 10 years now.  She reports that she just finds that she forgets everything, is unorganized, cannot focus on conversation or reading, is fidgety, and just often does not know what to do next in her day.  Depression screen [facility] 2/9 03/25/2020 12/11/2019 11/05/2019  Decreased Interest 3 2 2   Down, Depressed, Hopeless 1 3 3   PHQ - 2 Score 4 5  5  Altered sleeping 3 - -  Tired, decreased energy 2 - -  Change in appetite 0 - -  Feeling bad or failure about yourself  1 - -  Trouble concentrating 3 - -  Moving slowly or fidgety/restless 3 - -  Suicidal thoughts 0 - -  PHQ-9 Score 16 - -  Difficult doing work/chores Somewhat difficult - -   GAD 7 : Generalized Anxiety Score 03/25/2020 10/23/2019 09/11/2019  Nervous, Anxious, on Edge 1 1 2   Control/stop worrying 0 2 1  Worry too much - different things 3 3 3   Trouble relaxing 3 1 1   Restless 3 2 2   Easily annoyed or irritable 2 3 3   Afraid - awful might happen 0 0 0  Total GAD 7 Score 12 12 12   Anxiety Difficulty Very difficult - -    New  complaints: None  Social history:  Relevant past medical, surgical, family and social history reviewed and updated as indicated. Interim medical history since our last visit reviewed.  Allergies and medications reviewed and updated.  DATA REVIEWED: CHART IN EPIC  ROS: Negative unless specifically indicated above in HPI.   No current outpatient medications on file.  Current Facility-Administered Medications:  .  etonogestrel (NEXPLANON) implant 68 mg, 68 mg, Subdermal, Once, Dettinger, , MD   No Known Allergies Past Medical History:  Diagnosis Date  . ADHD   . HSV-2 (herpes simplex virus 2) infection     History reviewed. No pertinent surgical history.  Social History   Socioeconomic History  . Marital status: Single    Spouse name: Not on file  . Number of children: Not on file  . Years of education: Not on file  . Highest education level: Not on file  Occupational History  . Not on file  Tobacco Use  . Smoking status: Current Every Day Smoker    Packs/day: 0.50    Types: Cigarettes  . Smokeless tobacco: Never Used  Vaping Use  . Vaping Use: Never used  Substance and Sexual Activity  . Alcohol use: Yes    Comment: occ  . Drug use: Never  . Sexual activity: Yes    Birth control/protection: None  Other Topics Concern  . Not on file  Social History Narrative  . Not on file   Social Determinants of Health   Financial Resource Strain: Not on file  Food Insecurity: Not on file  Transportation Needs: Not on file  Physical Activity: Not on file  Stress: Not on file  Social Connections: Not on file  Intimate Partner Violence: Not on file        Objective:    BP (!) 106/97   Pulse 82   Temp 97.7 F (36.5 C) (Temporal)   Ht 5' 3.5" (1.613 m)   Wt 120 lb (54.4 kg)   SpO2 100%   BMI 20.92 kg/m   Wt Readings from Last 3 Encounters:  03/25/20 120 lb (54.4 kg)  12/11/19 117 lb (53.1 kg)  11/05/19 114 lb (51.7 kg)    Physical Exam Vitals  reviewed.  Constitutional:      General: She is not in acute distress.    Appearance: Normal appearance. She is not ill-appearing, toxic-appearing or diaphoretic.  HENT:     Head: Normocephalic and atraumatic.  Eyes:     General: No scleral icterus.       Right eye: No discharge.        Left eye: No discharge.  Conjunctiva/sclera: Conjunctivae normal.  Cardiovascular:     Rate and Rhythm: Normal rate.  Pulmonary:     Effort: Pulmonary effort is normal. No respiratory distress.  Musculoskeletal:        General: Normal range of motion.     Cervical back: Normal and normal range of motion.     Thoracic back: Bony tenderness present. No swelling, edema, deformity, signs of trauma, lacerations, spasms or tenderness. Normal range of motion. No scoliosis.     Lumbar back: Tenderness present. No swelling, edema, deformity, signs of trauma, lacerations, spasms or bony tenderness. Normal range of motion. No scoliosis.  Skin:    General: Skin is warm and dry.     Capillary Refill: Capillary refill takes less than 2 seconds.  Neurological:     General: No focal deficit present.     Mental Status: She is alert and oriented to person, place, and time. Mental status is at baseline.  Psychiatric:        Mood and Affect: Mood normal.        Behavior: Behavior normal.        Thought Content: Thought content normal.        Judgment: Judgment normal.     No results found for: TSH Lab Results  Component Value Date   WBC 6.9 10/23/2019   HGB 11.6 10/23/2019   HCT 34.3 10/23/2019   MCV 96 10/23/2019   PLT 239 10/23/2019   Lab Results  Component Value Date   NA 140 10/23/2019   K 3.9 10/23/2019   CO2 26 10/23/2019   GLUCOSE 70 10/23/2019   BUN 4 (L) 10/23/2019   CREATININE 0.61 10/23/2019   BILITOT 0.4 10/23/2019   ALKPHOS 48 10/23/2019   AST 11 10/23/2019   ALT 12 10/23/2019   PROT 6.3 10/23/2019   ALBUMIN 4.3 10/23/2019   CALCIUM 8.8 10/23/2019   Lab Results  Component Value  Date   CHOL 125 10/23/2019   Lab Results  Component Value Date   HDL 44 10/23/2019   Lab Results  Component Value Date   LDLCALC 70 10/23/2019   Lab Results  Component Value Date   TRIG 46 10/23/2019   Lab Results  Component Value Date   CHOLHDL 2.8 10/23/2019   No results found for: HGBA1C

## 2020-03-25 NOTE — Patient Instructions (Signed)
Heating pad Muscle rubs NSAID Muscle relaxer Physical therapy

## 2020-03-30 ENCOUNTER — Other Ambulatory Visit: Payer: Self-pay

## 2020-03-30 ENCOUNTER — Encounter: Payer: Self-pay | Admitting: Physical Therapy

## 2020-03-30 ENCOUNTER — Ambulatory Visit: Payer: Medicaid Other | Attending: Family Medicine | Admitting: Physical Therapy

## 2020-03-30 DIAGNOSIS — M6281 Muscle weakness (generalized): Secondary | ICD-10-CM | POA: Diagnosis present

## 2020-03-30 DIAGNOSIS — R293 Abnormal posture: Secondary | ICD-10-CM | POA: Diagnosis present

## 2020-03-30 DIAGNOSIS — M546 Pain in thoracic spine: Secondary | ICD-10-CM | POA: Diagnosis present

## 2020-03-30 DIAGNOSIS — G8929 Other chronic pain: Secondary | ICD-10-CM | POA: Diagnosis present

## 2020-03-30 DIAGNOSIS — M5441 Lumbago with sciatica, right side: Secondary | ICD-10-CM | POA: Insufficient documentation

## 2020-03-30 NOTE — Therapy (Signed)
[facility] [address] [place], [place], [address] Phone: [phone]   Fax:  [phone]  Physical Therapy Evaluation  Patient Details  Name: Gabrielle Black MRN: [id number] Date of Birth: [birth year] Referring Provider (PT): Deliah Boston, FNP   Encounter Date: 03/30/2020   PT End of Session - 03/30/20 1249    Visit Number 1    Number of Visits 12    Date for PT Re-Evaluation 05/18/20    Authorization Type [employer]; Progress note every 10th visit    PT Start Time 1117    PT Stop Time 1156    PT Time Calculation (min) 39 min    Activity Tolerance Patient tolerated treatment well    Behavior During Therapy [facility] for tasks assessed/performed           Past Medical History:  Diagnosis Date  . ADHD   . HSV-2 (herpes simplex virus 2) infection     History reviewed. No pertinent surgical history.  There were no vitals filed for this visit.    Subjective Assessment - 03/30/20 1302    Subjective COVID-19 screening performed upon arrival. Patient arrived to physical therapy with reports of chronic low back pain and thoracic pain that has progressively worsened over the past 13 years. Patient reports pain with daily activities particularly with heavy lifting, and coming up from a forward flexed position. Patient reports intermittent neurological symptoms down to right achilles. Patient reports lying on her stomach is the worst position. Patient pain at worst rated as 7/10 and pain at best as 2/10 with rest and a hot bath. Patient's goals are to decrease pain, improve movement, improve strength, and improve ability to perform home activities, ADLs, and recreational activities.    Limitations House hold activities;Standing;Walking;Sitting    Diagnostic tests x-ray: Normal results    Currently in Pain? Yes    Pain Score 6     Pain Location Back    Pain Orientation Lower;Upper    Pain Descriptors / Indicators Sore    Pain Type Chronic pain     Pain Radiating Towards Right achilles    Pain Onset More than a month ago    Pain Frequency Constant    Aggravating Factors  heavy lifting, coming up to standing from flexed postion    Pain Relieving Factors massage, heat, on medication but unsure of its effects    Effect of Pain on Daily Activities "hard to get out of bed, bend over, and laundry"              [facility] PT Assessment - 03/30/20 0001      Assessment   Medical Diagnosis Chronic midline low back pain without sciatica, acute bilateral throacic back pain    Referring Provider (PT) Deliah Boston, FNP    Onset Date/Surgical Date --   chronic 13 years for low back, 2 years for thoracic   Next MD Visit "to be scheduled"    Prior Therapy no      Precautions   Precautions None      Restrictions   Weight Bearing Restrictions No      Balance Screen   Has the patient fallen in the past 6 months No    Has the patient had a decrease in activity level because of a fear of falling?  No    Is the patient reluctant to leave their home because of a fear of falling?  No      Home Environment   Living Environment  Private residence    Living Arrangements Children;Spouse/significant other      Prior Function   Level of Independence Independent   but with pain     Posture/Postural Control   Posture/Postural Control Postural limitations    Postural Limitations Rounded Shoulders;Forward head;Increased lumbar lordosis;Decreased thoracic kyphosis;Anterior pelvic tilt   bilateral scapular winging     Deep Tendon Reflexes   DTR Assessment Site Patella    Patella DTR 2+   bilaterally     ROM / Strength   AROM / PROM / Strength AROM;Strength      AROM   AROM Assessment Site Lumbar    Lumbar Flexion finger tips to floor    Lumbar Extension 20 degreess    Lumbar - Right Side Bend 20.5" finger tip to floor    Lumbar - Left Side Bend 20.5" finger tip to floor   with right sided pain     Strength   Overall Strength Deficits     Strength Assessment Site Hip;Knee    Right/Left Hip Right;Left    Right Hip Flexion 4/5    Right Hip Extension 3+/5    Right Hip ABduction 3+/5    Left Hip Flexion 4/5    Left Hip Extension 3+/5    Left Hip ABduction 3+/5    Right/Left Knee Left;Right    Right Knee Flexion 4+/5    Right Knee Extension 4+/5    Left Knee Flexion 4+/5    Left Knee Extension 4+/5      Palpation   Spinal mobility WNL PA thoracic mobilization, decreased PA lumbar mobilization    Palpation comment increased tone to bilateral lumbar paraspinals and QL R>L; tenderness to PSIS and upper glute region      Ambulation/Gait   Gait Pattern Within Functional Limits                      Objective measurements completed on examination: See above findings.               PT Education - 03/30/20 1218    Education Details draw in, bridging, pelvic tilts in supine and sitting, rows with yellow theraband    Person(s) Educated Patient    Methods Explanation;Demonstration;Handout    Comprehension Verbalized understanding;Returned demonstration            PT Short Term Goals - 03/30/20 1252      PT SHORT TERM GOAL #1   Title Patient will be independent with intial HEP    Baseline no knowledge of HEP    Time 3    Period Weeks    Status New      PT SHORT TERM GOAL #2   Title Patient will report a 10% reduction of low back pain and thoracic pain with ADLs and home tasks.    Time 3    Period Weeks    Status New             PT Long Term Goals - 03/30/20 1257      PT LONG TERM GOAL #1   Title Patient will be independent with advanced HEP    Time 6    Period Weeks    Status New      PT LONG TERM GOAL #2   Title Patient will demontrate 4+/5 or greater bilateral LE MMT to improve stability during functional tasks.    Baseline 3+/5 to 4+/5 LE MMT    Time 6    Period Weeks  Status New      PT LONG TERM GOAL #3   Title Patient will report a centralization of right LE  neurological symptoms or an elimination of neurological symptoms down right LE to indicate reduced nerve irritation    Baseline down to R achilles intermittently    Time 6    Period Weeks    Status New      PT LONG TERM GOAL #4   Title Patient will report ability to peform ADLs and home tasks with low back and thoracic pain less than or equal to 3/10    Baseline 6/10 at worst    Time 6    Period Weeks    Status New                  Plan - 03/30/20 1956    Clinical Impression Statement Patient is a 30 year old female who presents to physical therapy with chronic low back pain and thoracic pain that began about 13 years and 2 years ago, respectively. Patient noted with [facility] thoracolumbar ROM and decreased bilateral LE MMT. Patella DTR WFL bilaterally. Patient (+) with R slump and SLR test. Patient and PT discussed plan of care and discussed HEP to which patient reported understanding. Patient would benefit from skilled physical therapy to address deficits and patient's goals.    Personal Factors and Comorbidities Age;Time since onset of injury/illness/exacerbation;Comorbidity 1    Comorbidities ADHD    Examination-Activity Limitations Locomotion Level;Bed Mobility;Stairs;Stand    Examination-Participation Restrictions Cleaning;Meal Prep    Stability/Clinical Decision Making Stable/Uncomplicated    Clinical Decision Making Low    Rehab Potential Fair    PT Frequency 2x / week    PT Duration 6 weeks    PT Treatment/Interventions ADLs/Self Care Home Management;Gait training;Stair training;Functional mobility training;Therapeutic activities;Therapeutic exercise;Balance training;Neuromuscular re-education;Manual techniques;Passive range of motion;Patient/family education    PT Next Visit Plan Nustep, UBE, postural exercises, core stabilization and LE strengthening, (healthy blue: no e-stim, ultrasound, traction, vaso)    PT Home Exercise Plan see patient education section    Consulted  and Agree with Plan of Care Patient           Patient will benefit from skilled therapeutic intervention in order to improve the following deficits and impairments:  Decreased activity tolerance,Decreased range of motion,Pain,Postural dysfunction,Decreased strength  Visit Diagnosis: Chronic bilateral low back pain with right-sided sciatica - Plan: PT plan of care cert/re-cert  Pain in thoracic spine - Plan: PT plan of care cert/re-cert  Abnormal posture - Plan: PT plan of care cert/re-cert  Muscle weakness (generalized) - Plan: PT plan of care cert/re-cert     Problem List Patient Active Problem List   Diagnosis Date Noted  . Current mild episode of major depressive disorder without prior episode (HCC) 09/11/2019  . Generalized anxiety disorder 09/11/2019    Guss Bunde, PT, DPT 03/30/2020, 8:08 PM  [facility] [address] [place], [place], [address] Phone: [phone]   Fax:  [phone]  Name: Birda Didonato MRN: [id number] Date of Birth: [birth year]

## 2020-04-22 ENCOUNTER — Telehealth: Payer: Self-pay | Admitting: *Deleted

## 2020-04-22 NOTE — Telephone Encounter (Signed)
Transition Care Management Unsuccessful Follow-up Telephone Call  Date of discharge and from where:  04/21/2020 - [facility] ED  Attempts:  1st Attempt  Reason for unsuccessful TCM follow-up call:  Unable to leave message

## 2020-04-23 NOTE — Telephone Encounter (Signed)
Transition Care Management Unsuccessful Follow-up Telephone Call  Date of discharge and from where:  04/21/2020 - [facility] ED  Attempts:  2nd Attempt  Reason for unsuccessful TCM follow-up call:  Unable to leave message

## 2020-09-10 ENCOUNTER — Ambulatory Visit: Payer: Medicaid Other | Admitting: Nurse Practitioner

## 2020-09-10 ENCOUNTER — Encounter: Payer: Self-pay | Admitting: Nurse Practitioner

## 2020-09-10 ENCOUNTER — Other Ambulatory Visit: Payer: Self-pay

## 2020-09-10 VITALS — BP 123/75 | HR 98 | Temp 97.4°F | Ht 64.0 in | Wt 107.0 lb

## 2020-09-10 DIAGNOSIS — R300 Dysuria: Secondary | ICD-10-CM

## 2020-09-10 LAB — URINALYSIS
Bilirubin, UA: NEGATIVE
Glucose, UA: NEGATIVE
Ketones, UA: NEGATIVE
Nitrite, UA: NEGATIVE
Protein,UA: NEGATIVE
Specific Gravity, UA: 1.015 (ref 1.005–1.030)
Urobilinogen, Ur: 1 mg/dL (ref 0.2–1.0)
pH, UA: 7.5 (ref 5.0–7.5)

## 2020-09-10 MED ORDER — CEPHALEXIN 500 MG PO CAPS: 500.0000 mg | ORAL_CAPSULE | Freq: Two times a day (BID) | ORAL | 0 refills | Status: DC

## 2020-09-10 NOTE — Patient Instructions (Signed)

## 2020-09-10 NOTE — Progress Notes (Signed)
Acute Office Visit  Subjective:    Patient ID: Gabrielle Black, female    DOB: 10-21-1990, 30 y.o.   MRN: [id number]  Chief Complaint  Patient presents with   Dysuria    Dysuria  This is a recurrent problem. The problem occurs every urination. The problem has been gradually worsening. The quality of the pain is described as burning. The pain is moderate. There has been no fever. She is Sexually active. Associated symptoms include flank pain. Pertinent negatives include no chills, discharge, nausea, possible pregnancy or vomiting. She has tried nothing for the symptoms.    Past Medical History:  Diagnosis Date   ADHD    HSV-2 (herpes simplex virus 2) infection     History reviewed. No pertinent surgical history.  Family History  Adopted: Yes  Problem Relation Age of Onset   Hypertension Maternal Uncle    Hyperlipidemia Maternal Uncle     Social History   Socioeconomic History   Marital status: Single    Spouse name: Not on file   Number of children: Not on file   Years of education: Not on file   Highest education level: Not on file  Occupational History   Not on file  Tobacco Use   Smoking status: Former    Packs/day: 0.50    Types: Cigarettes   Smokeless tobacco: Never  Vaping Use   Vaping Use: Every day  Substance and Sexual Activity   Alcohol use: Not Currently    Comment: occ   Drug use: Not Currently   Sexual activity: Yes    Birth control/protection: Inserts  Other Topics Concern   Not on file  Social History Narrative   Not on file   Social Determinants of Health   Financial Resource Strain: Not on file  Food Insecurity: Not on file  Transportation Needs: Not on file  Physical Activity: Not on file  Stress: Not on file  Social Connections: Not on file  Intimate Partner Violence: Not on file    Outpatient Medications Prior to Visit  Medication Sig Dispense Refill   Buprenorphine HCl-Naloxone HCl (SUBOXONE) 8-2 MG FILM TAKE 1 AND 1/2 FILMS  SUBLINGUALLY DAILY     atomoxetine (STRATTERA) 40 MG capsule Take 1 capsule (40 mg total) by mouth daily. 30 capsule 2   meloxicam (MOBIC) 7.5 MG tablet Take 1 tablet (7.5 mg total) by mouth daily. 30 tablet 2   methocarbamol (ROBAXIN) 500 MG tablet Take 1 tablet (500 mg total) by mouth every 8 (eight) hours as needed for muscle spasms. 30 tablet 2   Facility-Administered Medications Prior to Visit  Medication Dose Route Frequency Provider Last Rate Last Admin   etonogestrel (NEXPLANON) implant 68 mg  68 mg Subdermal Once Dettinger, Elige Radon, MD        No Known Allergies  Review of Systems  Constitutional: Negative.  Negative for chills.  HENT: Negative.    Respiratory: Negative.    Gastrointestinal: Negative.  Negative for nausea and vomiting.  Genitourinary:  Positive for dysuria and flank pain. Negative for pelvic pain.  Skin:  Negative for rash.  All other systems reviewed and are negative.     Objective:    Physical Exam Vitals and nursing note reviewed.  Constitutional:      Appearance: Normal appearance.  HENT:     Head: Normocephalic.     Nose: Nose normal.  Eyes:     Conjunctiva/sclera: Conjunctivae normal.  Cardiovascular:     Rate and Rhythm: Normal rate  and regular rhythm.     Pulses: Normal pulses.     Heart sounds: Normal heart sounds.  Pulmonary:     Effort: Pulmonary effort is normal.     Breath sounds: Normal breath sounds.  Abdominal:     General: Bowel sounds are normal.     Tenderness: There is left CVA tenderness.  Skin:    Findings: No rash.  Neurological:     Mental Status: She is alert and oriented to person, place, and time.  Psychiatric:        Behavior: Behavior normal.    BP 123/75   Pulse 98   Temp (!) 97.4 F (36.3 C) (Temporal)   Ht 5\' 4"  (1.626 m)   Wt 107 lb (48.5 kg)   SpO2 97%   BMI 18.37 kg/m  Wt Readings from Last 3 Encounters:  09/10/20 107 lb (48.5 kg)  03/25/20 120 lb (54.4 kg)  12/11/19 117 lb (53.1 kg)     Health Maintenance Due  Topic Date Due   COVID-19 Vaccine (1) Never done   Pneumococcal Vaccine 18-85 Years old (1 - PCV) Never done    There are no preventive care reminders to display for this patient.   No results found for: TSH Lab Results  Component Value Date   WBC 6.9 10/23/2019   HGB 11.6 10/23/2019   HCT 34.3 10/23/2019   MCV 96 10/23/2019   PLT 239 10/23/2019   Lab Results  Component Value Date   NA 140 10/23/2019   K 3.9 10/23/2019   CO2 26 10/23/2019   GLUCOSE 70 10/23/2019   BUN 4 (L) 10/23/2019   CREATININE 0.61 10/23/2019   BILITOT 0.4 10/23/2019   ALKPHOS 48 10/23/2019   AST 11 10/23/2019   ALT 12 10/23/2019   PROT 6.3 10/23/2019   ALBUMIN 4.3 10/23/2019   CALCIUM 8.8 10/23/2019   Lab Results  Component Value Date   CHOL 125 10/23/2019   Lab Results  Component Value Date   HDL 44 10/23/2019   Lab Results  Component Value Date   LDLCALC 70 10/23/2019   Lab Results  Component Value Date   TRIG 46 10/23/2019   Lab Results  Component Value Date   CHOLHDL 2.8 10/23/2019   No results found for: HGBA1C     Assessment & Plan:   Problem List Items Addressed This Visit       Other   Dysuria - Primary    Patient is in clinic today for recurrent UTI symptoms.  Symptoms associated with foul-smelling urine, burning, CVA tenderness and frequency.  No fever, nausea, rash, vomiting reported.  Completed urinalysis which shows positive leukocytes and blood.  Urine cultures completed results pending.  Started patient on Keflex 500 mg tablet by mouth twice daily.  We will switch out antibiotic with urine culture.  Patient knows to follow-up with worsening unresolved symptoms.       Relevant Medications   cephALEXin (KEFLEX) 500 MG capsule   Other Relevant Orders   Urine Culture   Urinalysis     Meds ordered this encounter  Medications   cephALEXin (KEFLEX) 500 MG capsule    Sig: Take 1 capsule (500 mg total) by mouth 2 (two) times daily.     Dispense:  14 capsule    Refill:  0    Order Specific Question:   Supervising Provider    Answer:   12/23/2019 Raliegh Ip      [[id number]], NP

## 2020-09-10 NOTE — Assessment & Plan Note (Signed)
Patient is in clinic today for recurrent UTI symptoms.  Symptoms associated with foul-smelling urine, burning, CVA tenderness and frequency.  No fever, nausea, rash, vomiting reported.  Completed urinalysis which shows positive leukocytes and blood.  Urine cultures completed results pending.  Started patient on Keflex 500 mg tablet by mouth twice daily.  We will switch out antibiotic with urine culture.  Patient knows to follow-up with worsening unresolved symptoms.

## 2020-09-15 LAB — URINE CULTURE

## 2020-09-17 ENCOUNTER — Other Ambulatory Visit: Payer: Self-pay | Admitting: Nurse Practitioner

## 2020-09-17 DIAGNOSIS — N3000 Acute cystitis without hematuria: Secondary | ICD-10-CM | POA: Insufficient documentation

## 2020-09-17 MED ORDER — AMOXICILLIN-POT CLAVULANATE 875-125 MG PO TABS
1.0000 | ORAL_TABLET | Freq: Two times a day (BID) | ORAL | 0 refills | Status: DC
Start: 2020-09-17 — End: 2021-07-19

## 2021-07-19 ENCOUNTER — Other Ambulatory Visit (HOSPITAL_COMMUNITY)
Admission: RE | Admit: 2021-07-19 | Discharge: 2021-07-19 | Disposition: A | Discharge status: Home / Self Care | Payer: Medicaid Other | Source: Ambulatory Visit | Attending: Family Medicine | Admitting: Family Medicine

## 2021-07-19 ENCOUNTER — Ambulatory Visit (INDEPENDENT_AMBULATORY_CARE_PROVIDER_SITE_OTHER): Payer: Medicaid Other | Admitting: Family Medicine

## 2021-07-19 ENCOUNTER — Encounter: Payer: Self-pay | Admitting: Family Medicine

## 2021-07-19 VITALS — BP 124/77 | HR 112 | Temp 98.2°F | Ht 64.0 in | Wt 124.0 lb

## 2021-07-19 DIAGNOSIS — Z23 Encounter for immunization: Secondary | ICD-10-CM

## 2021-07-19 DIAGNOSIS — Z124 Encounter for screening for malignant neoplasm of cervix: Secondary | ICD-10-CM | POA: Diagnosis present

## 2021-07-19 DIAGNOSIS — Z113 Encounter for screening for infections with a predominantly sexual mode of transmission: Secondary | ICD-10-CM | POA: Diagnosis present

## 2021-07-19 DIAGNOSIS — R300 Dysuria: Secondary | ICD-10-CM

## 2021-07-19 DIAGNOSIS — Z0000 Encounter for general adult medical examination without abnormal findings: Secondary | ICD-10-CM

## 2021-07-19 DIAGNOSIS — Z0001 Encounter for general adult medical examination with abnormal findings: Secondary | ICD-10-CM

## 2021-07-19 LAB — URINALYSIS, ROUTINE W REFLEX MICROSCOPIC
Bilirubin, UA: NEGATIVE
Glucose, UA: NEGATIVE
Ketones, UA: NEGATIVE
Leukocytes,UA: NEGATIVE
Nitrite, UA: NEGATIVE
Protein,UA: NEGATIVE
RBC, UA: NEGATIVE
Specific Gravity, UA: 1.01 (ref 1.005–1.030)
Urobilinogen, Ur: 0.2 mg/dL (ref 0.2–1.0)
pH, UA: 7 (ref 5.0–7.5)

## 2021-07-19 NOTE — Progress Notes (Signed)
Assessment & Plan:  Well adult exam Discussed health benefits of physical activity, and encouraged her to engage in regular exercise appropriate for her age and condition. Preventive health education provided.   Immunization History  Administered Date(s) Administered   Tdap 07/19/2021   Health Maintenance  Topic Date Due   COVID-19 Vaccine (1) 07/31/2022 (Originally 07/08/1991)   INFLUENZA VACCINE  09/20/2021   PAP-Cervical Cytology Screening  10/23/2022   PAP SMEAR-Modifier  10/23/2022   TETANUS/TDAP  07/20/2031   Hepatitis C Screening  Completed   HIV Screening  Completed   HPV VACCINES  Aged Out  - CBC with Differential/Platelet - CMP14+EGFR - Lipid panel  2. Screening for cervical cancer - Cytology - PAP  3. Routine screening for STI (sexually transmitted infection) - Cytology - PAP - RPR - HIV antibody (with reflex) - Acute Viral Hepatitis (HAV, HBV, HCV)  4. Dysuria Encouraged to drink plenty of fluid. - Urinalysis, Routine w reflex microscopic - Urine dipstick shows negative for all components.  5. Need for Tdap vaccination - Tdap vaccine greater than or equal to 7yo IM   Follow-up: Return in about 1 year (around 07/20/2022) for annual physical.   Hendricks Limes, MSN, APRN, FNP-C [facility]  Subjective:  Patient ID: Gabrielle Black, female    DOB: May 06, 1990  Age: 31 y.o. MRN: [id number]  Patient Care Team: Loman Brooklyn, FNP as PCP - General (Family Medicine)   CC:  Chief Complaint  Patient presents with   Medical Management of Chronic Issues    CPE with Pap    HPI Gabrielle Black is a 31 y.o. female who presents today for a complete physical exam. She reports consuming a general diet. The patient does not participate in regular exercise at present. She generally feels well. She reports sleeping well. She does have additional problems to discuss today.   Vision:Not within last year Dental:No regular dental care  DEPRESSION  SCREENING    07/19/2021    2:08 PM 03/25/2020    2:25 PM 12/11/2019    3:56 PM 11/05/2019   11:53 AM 10/23/2019    1:18 PM 09/11/2019    1:48 PM  PHQ 2/9 Scores  PHQ - 2 Score 0 4 5 5 5 3   PHQ- 9 Score  16   13 11      Patient complains of dysuria and cloudy urine . She has had symptoms for 2 days. Patient denies fever. Patient does have a history of recurrent UTI.  Patient does not have a history of pyelonephritis.   Review of Systems  Constitutional:  Negative for chills, fever, malaise/fatigue and weight loss.  HENT:  Negative for congestion, ear discharge, ear pain, nosebleeds, sinus pain, sore throat and tinnitus.   Eyes:  Negative for blurred vision, double vision, pain, discharge and redness.  Respiratory:  Negative for cough, shortness of breath and wheezing.   Cardiovascular:  Negative for chest pain, palpitations and leg swelling.  Gastrointestinal:  Negative for abdominal pain, constipation, diarrhea, heartburn, nausea and vomiting.  Genitourinary:  Negative for dysuria, frequency and urgency.  Musculoskeletal:  Negative for myalgias.  Skin:  Negative for rash.  Neurological:  Negative for dizziness, seizures, weakness and headaches.  Psychiatric/Behavioral:  Negative for depression, substance abuse and suicidal ideas. The patient is not nervous/anxious.     Current Outpatient Medications:    LUCEMYRA 0.18 MG TABS, Take 1 tablet by mouth 4 (four) times daily as needed., Disp: , Rfl:    naloxone (NARCAN)  nasal spray 4 mg/0.1 mL, SMARTSIG:1 Spray(s) Both Nares As Directed PRN, Disp: , Rfl:   Current Facility-Administered Medications:    etonogestrel (NEXPLANON) implant 68 mg, 68 mg, Subdermal, Once, Dettinger, Fransisca Kaufmann, MD  No Known Allergies  Past Medical History:  Diagnosis Date   ADHD    HSV-2 (herpes simplex virus 2) infection     History reviewed. No pertinent surgical history.  Family History  Adopted: Yes  Problem Relation Age of Onset   Hypertension Maternal  Uncle    Hyperlipidemia Maternal Uncle     Social History   Socioeconomic History   Marital status: Single    Spouse name: Not on file   Number of children: Not on file   Years of education: Not on file   Highest education level: Not on file  Occupational History   Not on file  Tobacco Use   Smoking status: Former    Packs/day: 0.50    Types: Cigarettes   Smokeless tobacco: Never  Vaping Use   Vaping Use: Every day  Substance and Sexual Activity   Alcohol use: Not Currently    Comment: occ   Drug use: Not Currently   Sexual activity: Yes    Birth control/protection: Inserts  Other Topics Concern   Not on file  Social History Narrative   Not on file   Social Determinants of Health   Financial Resource Strain: Not on file  Food Insecurity: Not on file  Transportation Needs: Not on file  Physical Activity: Not on file  Stress: Not on file  Social Connections: Not on file  Intimate Partner Violence: Not on file      Objective:    BP 124/77   Pulse (!) 112   Temp 98.2 F (36.8 C)   Ht 5' 4"  (1.626 m)   Wt 124 lb (56.2 kg)   LMP 05/19/2021 (Approximate)   SpO2 98%   BMI 21.28 kg/m     Physical Exam Vitals reviewed. Exam conducted with a chaperone present (A. Hill).  Constitutional:      General: She is not in acute distress.    Appearance: Normal appearance. She is normal weight. She is not ill-appearing, toxic-appearing or diaphoretic.  HENT:     Head: Normocephalic and atraumatic.     Right Ear: Tympanic membrane, ear canal and external ear normal. There is no impacted cerumen.     Left Ear: Tympanic membrane, ear canal and external ear normal. There is no impacted cerumen.     Nose: Nose normal. No congestion or rhinorrhea.     Mouth/Throat:     Mouth: Mucous membranes are moist.     Pharynx: Oropharynx is clear. No oropharyngeal exudate or posterior oropharyngeal erythema.  Eyes:     General: No scleral icterus.       Right eye: No discharge.         Left eye: No discharge.     Conjunctiva/sclera: Conjunctivae normal.     Pupils: Pupils are equal, round, and reactive to light.  Cardiovascular:     Rate and Rhythm: Normal rate and regular rhythm.     Heart sounds: Normal heart sounds. No murmur heard.   No friction rub. No gallop.  Pulmonary:     Effort: Pulmonary effort is normal. No respiratory distress.     Breath sounds: Normal breath sounds. No stridor. No wheezing, rhonchi or rales.  Chest:  Breasts:    Breasts are symmetrical.     Right: Normal.  Left: Normal.  Abdominal:     General: Abdomen is flat. Bowel sounds are normal. There is no distension.     Palpations: Abdomen is soft. There is no hepatomegaly, splenomegaly or mass.     Tenderness: There is no abdominal tenderness. There is no guarding or rebound.     Hernia: No hernia is present.  Genitourinary:    Pubic Area: No rash or pubic lice.      Labia:        Right: No rash, tenderness, lesion or injury.        Left: No rash, tenderness, lesion or injury.      Vagina: Normal.     Cervix: Normal.     Uterus: Normal.      Adnexa: Right adnexa normal and left adnexa normal.  Musculoskeletal:        General: Normal range of motion.     Cervical back: Normal range of motion and neck supple. No rigidity. No muscular tenderness.  Lymphadenopathy:     Cervical: No cervical adenopathy.     Upper Body:     Right upper body: No supraclavicular, axillary or pectoral adenopathy.     Left upper body: No supraclavicular, axillary or pectoral adenopathy.  Skin:    General: Skin is warm and dry.     Capillary Refill: Capillary refill takes less than 2 seconds.  Neurological:     General: No focal deficit present.     Mental Status: She is alert and oriented to person, place, and time. Mental status is at baseline.  Psychiatric:        Mood and Affect: Mood normal.        Behavior: Behavior normal.        Thought Content: Thought content normal.        Judgment:  Judgment normal.    No results found for: TSH Lab Results  Component Value Date   WBC 6.9 10/23/2019   HGB 11.6 10/23/2019   HCT 34.3 10/23/2019   MCV 96 10/23/2019   PLT 239 10/23/2019   Lab Results  Component Value Date   NA 140 10/23/2019   K 3.9 10/23/2019   CO2 26 10/23/2019   GLUCOSE 70 10/23/2019   BUN 4 (L) 10/23/2019   CREATININE 0.61 10/23/2019   BILITOT 0.4 10/23/2019   ALKPHOS 48 10/23/2019   AST 11 10/23/2019   ALT 12 10/23/2019   PROT 6.3 10/23/2019   ALBUMIN 4.3 10/23/2019   CALCIUM 8.8 10/23/2019   Lab Results  Component Value Date   CHOL 125 10/23/2019   Lab Results  Component Value Date   HDL 44 10/23/2019   Lab Results  Component Value Date   LDLCALC 70 10/23/2019   Lab Results  Component Value Date   TRIG 46 10/23/2019   Lab Results  Component Value Date   CHOLHDL 2.8 10/23/2019   No results found for: HGBA1C

## 2021-07-20 LAB — CBC WITH DIFFERENTIAL/PLATELET
Basophils Absolute: 0 10*3/uL (ref 0.0–0.2)
Basos: 0 %
EOS (ABSOLUTE): 0.3 10*3/uL (ref 0.0–0.4)
Eos: 5 %
Hematocrit: 36.9 % (ref 34.0–46.6)
Hemoglobin: 12.2 g/dL (ref 11.1–15.9)
Immature Grans (Abs): 0.1 10*3/uL (ref 0.0–0.1)
Immature Granulocytes: 1 %
Lymphocytes Absolute: 2 10*3/uL (ref 0.7–3.1)
Lymphs: 36 %
MCH: 29.8 pg (ref 26.6–33.0)
MCHC: 33.1 g/dL (ref 31.5–35.7)
MCV: 90 fL (ref 79–97)
Monocytes Absolute: 0.5 10*3/uL (ref 0.1–0.9)
Monocytes: 9 %
Neutrophils Absolute: 2.8 10*3/uL (ref 1.4–7.0)
Neutrophils: 49 %
Platelets: 214 10*3/uL (ref 150–450)
RBC: 4.1 x10E6/uL (ref 3.77–5.28)
RDW: 12.9 % (ref 11.7–15.4)
WBC: 5.7 10*3/uL (ref 3.4–10.8)

## 2021-07-20 LAB — ACUTE VIRAL HEPATITIS (HAV, HBV, HCV)
HCV Ab: NONREACTIVE
Hep A IgM: NEGATIVE
Hep B C IgM: NEGATIVE
Hepatitis B Surface Ag: NEGATIVE

## 2021-07-20 LAB — HIV ANTIBODY (ROUTINE TESTING W REFLEX): HIV Screen 4th Generation wRfx: NONREACTIVE

## 2021-07-20 LAB — CMP14+EGFR
ALT: 8 IU/L (ref 0–32)
AST: 12 IU/L (ref 0–40)
Albumin/Globulin Ratio: 2 (ref 1.2–2.2)
Albumin: 4.3 g/dL (ref 3.9–5.0)
Alkaline Phosphatase: 57 IU/L (ref 44–121)
BUN/Creatinine Ratio: 7 — ABNORMAL LOW (ref 9–23)
BUN: 5 mg/dL — ABNORMAL LOW (ref 6–20)
Bilirubin Total: 0.3 mg/dL (ref 0.0–1.2)
CO2: 22 mmol/L (ref 20–29)
Calcium: 9.5 mg/dL (ref 8.7–10.2)
Chloride: 105 mmol/L (ref 96–106)
Creatinine, Ser: 0.69 mg/dL (ref 0.57–1.00)
Globulin, Total: 2.1 g/dL (ref 1.5–4.5)
Glucose: 76 mg/dL (ref 70–99)
Potassium: 3.8 mmol/L (ref 3.5–5.2)
Sodium: 141 mmol/L (ref 134–144)
Total Protein: 6.4 g/dL (ref 6.0–8.5)
eGFR: 120 mL/min/{1.73_m2} (ref >59)

## 2021-07-20 LAB — LIPID PANEL
Chol/HDL Ratio: 2.5 ratio (ref 0.0–4.4)
Cholesterol, Total: 123 mg/dL (ref 100–199)
HDL: 50 mg/dL (ref >39)
LDL Chol Calc (NIH): 63 mg/dL (ref 0–99)
Triglycerides: 42 mg/dL (ref 0–149)
VLDL Cholesterol Cal: 10 mg/dL (ref 5–40)

## 2021-07-20 LAB — HCV INTERPRETATION

## 2021-07-20 LAB — RPR: RPR Ser Ql: NONREACTIVE

## 2021-07-21 LAB — CYTOLOGY - PAP
Chlamydia: NEGATIVE
Comment: NEGATIVE
Comment: NEGATIVE
Comment: NORMAL
Diagnosis: NEGATIVE
Neisseria Gonorrhea: NEGATIVE
Trichomonas: NEGATIVE

## 2021-10-12 ENCOUNTER — Encounter: Payer: Self-pay | Admitting: *Deleted

## 2021-11-17 ENCOUNTER — Telehealth: Payer: Self-pay

## 2021-11-17 NOTE — Telephone Encounter (Signed)
Transition Care Management Unsuccessful Follow-up Telephone Call  Date of discharge and from where:  11/16/2021 [facility]  Attempts:  1st Attempt  Reason for unsuccessful TCM follow-up call:  Voice mail full

## 2021-11-17 NOTE — Telephone Encounter (Signed)
Transition Care Management Unsuccessful Follow-up Telephone Call  Date of discharge and from where:  11/16/21 [facility]  Attempts:  2nd Attempt  Reason for unsuccessful TCM follow-up call:  Voice mail full

## 2022-04-20 IMAGING — DX DG LUMBAR SPINE 2-3V
2 series · 2 of 2 positions shown · non-contrast
Comparison: None.

CLINICAL DATA: Back pain

EXAM:
LUMBAR SPINE - 2-3 VIEW; THORACIC SPINE 2 VIEWS

[l-spine ap]
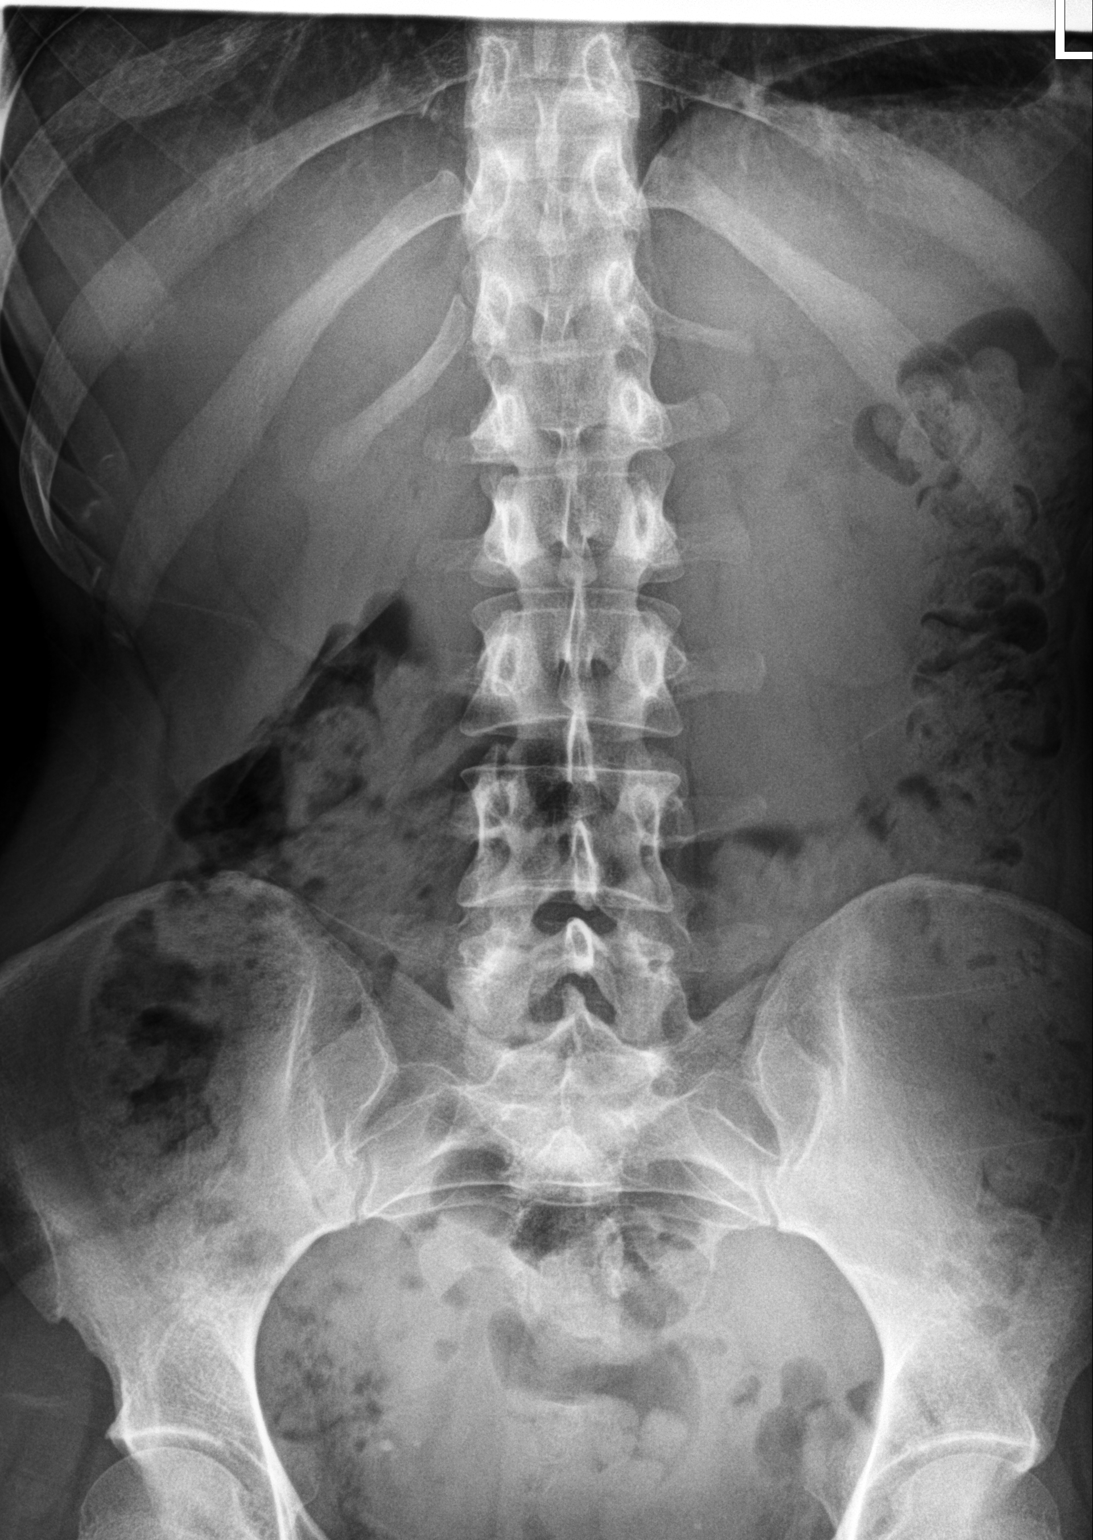

[l-spine lat]
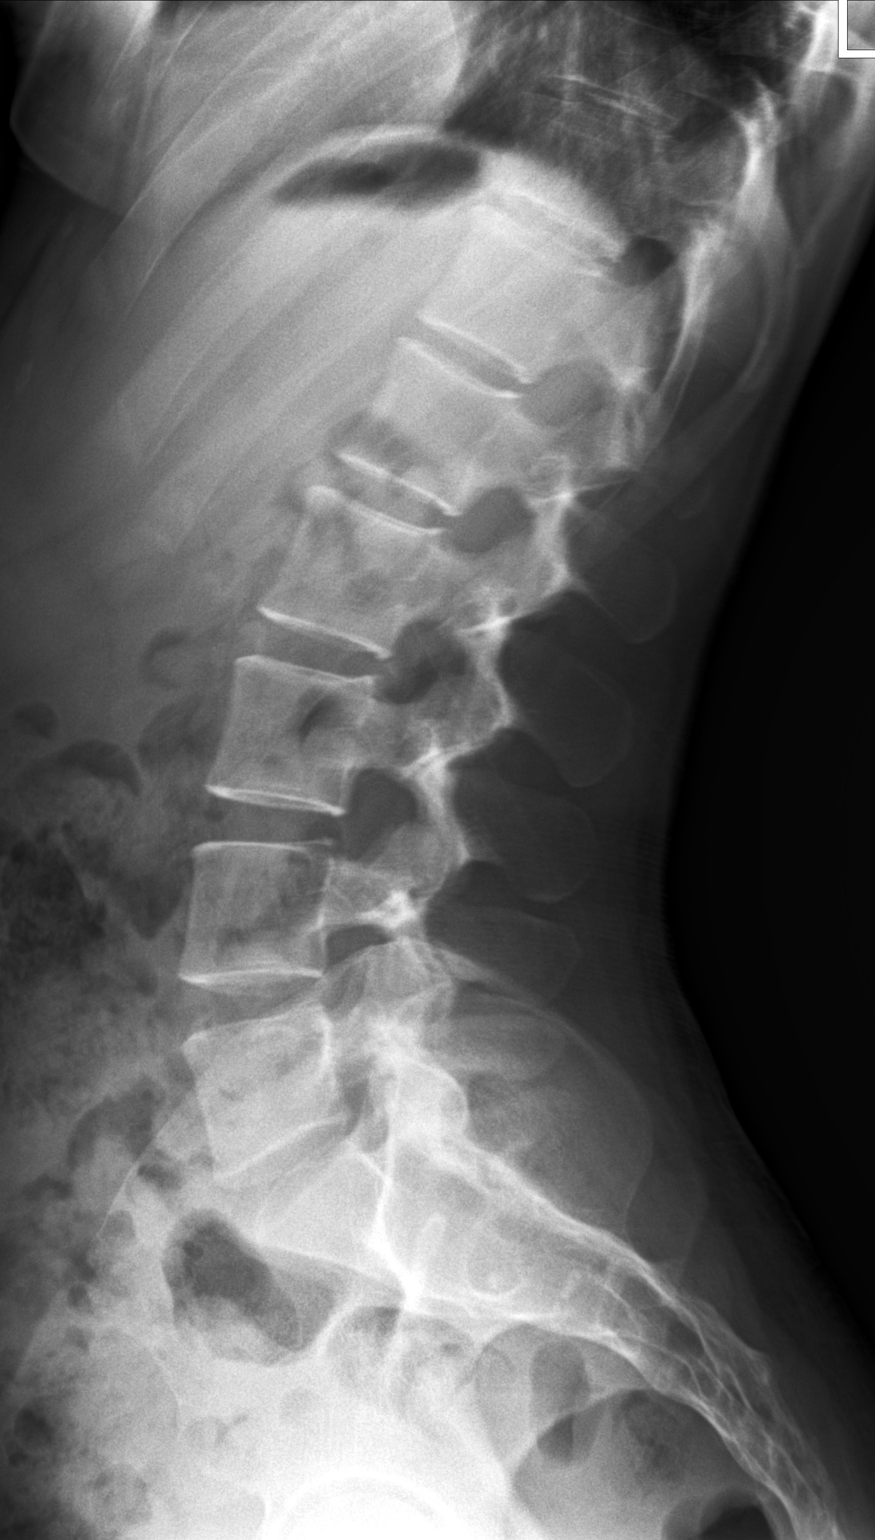

[2 of 2 positions shown; findings below may reference images not displayed]

FINDINGS: Twelve rib-bearing vertebral levels with rudimentary ribs at the T12
level. Mild anterior wedging at the T11 level, which may be a
physiologic finding towards the thoracolumbar junction though should
correlate for point tenderness. Few scattered Schmorl's nodes. No
other acute or suspicious osseous abnormalities. Included portions
of the chest and mediastinum are unremarkable.

5 lumbar type vertebral levels with likely congenital nonfusion of
the right L1 transverse process. Preservation of the normal lumbar
lordosis. No significant spondylolisthesis. Normal bone
mineralization. No worrisome osseous lesions. Included portions of
the abdomen and pelvis are free of acute or worrisome abnormality.
Imaged portions of the bony pelvis are intact and congruent.
IMPRESSION: 1. Mild anterior wedging at the T11 level, which may be physiologic
finding towards the thoracolumbar junction though should correlate
for point tenderness.
2. No other acute or conspicuous osseous abnormality.
3. Few scattered benign Schmorl's node formations.

## 2022-07-25 ENCOUNTER — Encounter: Payer: Medicaid Other | Admitting: Family Medicine

## 2022-07-25 ENCOUNTER — Encounter: Payer: Self-pay | Admitting: Family Medicine

## 2023-06-27 ENCOUNTER — Emergency Department (HOSPITAL_BASED_OUTPATIENT_CLINIC_OR_DEPARTMENT_OTHER): Payer: MEDICAID | Admitting: Radiology

## 2023-06-27 ENCOUNTER — Other Ambulatory Visit: Payer: Self-pay

## 2023-06-27 ENCOUNTER — Encounter (HOSPITAL_BASED_OUTPATIENT_CLINIC_OR_DEPARTMENT_OTHER): Payer: Self-pay

## 2023-06-27 ENCOUNTER — Inpatient Hospital Stay (HOSPITAL_BASED_OUTPATIENT_CLINIC_OR_DEPARTMENT_OTHER)
Admission: EM | Admit: 2023-06-27 | Discharge: 2023-07-03 | DRG: 571 | Disposition: A | Discharge status: Home / Self Care | Payer: MEDICAID | Attending: Family Medicine | Admitting: Family Medicine

## 2023-06-27 DIAGNOSIS — F1410 Cocaine abuse, uncomplicated: Secondary | ICD-10-CM | POA: Diagnosis present

## 2023-06-27 DIAGNOSIS — L0390 Cellulitis, unspecified: Secondary | ICD-10-CM | POA: Diagnosis present

## 2023-06-27 DIAGNOSIS — I96 Gangrene, not elsewhere classified: Secondary | ICD-10-CM | POA: Diagnosis present

## 2023-06-27 DIAGNOSIS — F1990 Other psychoactive substance use, unspecified, uncomplicated: Secondary | ICD-10-CM | POA: Diagnosis present

## 2023-06-27 DIAGNOSIS — L03116 Cellulitis of left lower limb: Principal | ICD-10-CM | POA: Diagnosis present

## 2023-06-27 DIAGNOSIS — F1910 Other psychoactive substance abuse, uncomplicated: Secondary | ICD-10-CM | POA: Diagnosis present

## 2023-06-27 DIAGNOSIS — I959 Hypotension, unspecified: Secondary | ICD-10-CM | POA: Insufficient documentation

## 2023-06-27 DIAGNOSIS — F1113 Opioid abuse with withdrawal: Secondary | ICD-10-CM | POA: Diagnosis present

## 2023-06-27 DIAGNOSIS — Z87891 Personal history of nicotine dependence: Secondary | ICD-10-CM

## 2023-06-27 DIAGNOSIS — L97929 Non-pressure chronic ulcer of unspecified part of left lower leg with unspecified severity: Secondary | ICD-10-CM | POA: Diagnosis present

## 2023-06-27 DIAGNOSIS — Z8249 Family history of ischemic heart disease and other diseases of the circulatory system: Secondary | ICD-10-CM

## 2023-06-27 HISTORY — DX: Other psychoactive substance use, unspecified, uncomplicated: F19.90

## 2023-06-27 LAB — COMPREHENSIVE METABOLIC PANEL WITH GFR
ALT: 13 U/L (ref 0–44)
AST: 23 U/L (ref 15–41)
Albumin: 4.8 g/dL (ref 3.5–5.0)
Alkaline Phosphatase: 49 U/L (ref 38–126)
Anion gap: 13 (ref 5–15)
BUN: 10 mg/dL (ref 6–20)
CO2: 25 mmol/L (ref 22–32)
Calcium: 10 mg/dL (ref 8.9–10.3)
Chloride: 101 mmol/L (ref 98–111)
Creatinine, Ser: 0.82 mg/dL (ref 0.44–1.00)
GFR, Estimated: >60 mL/min (ref >60)
Glucose, Bld: 77 mg/dL (ref 70–99)
Potassium: 4 mmol/L (ref 3.5–5.1)
Sodium: 139 mmol/L (ref 135–145)
Total Bilirubin: 0.4 mg/dL (ref 0.0–1.2)
Total Protein: 7.4 g/dL (ref 6.5–8.1)

## 2023-06-27 LAB — PREGNANCY, URINE: Preg Test, Ur: NEGATIVE

## 2023-06-27 LAB — URINALYSIS, W/ REFLEX TO CULTURE (INFECTION SUSPECTED)
Bilirubin Urine: NEGATIVE
Glucose, UA: NEGATIVE mg/dL
Ketones, ur: NEGATIVE mg/dL
Leukocytes,Ua: NEGATIVE
Nitrite: NEGATIVE
Protein, ur: NEGATIVE mg/dL
Specific Gravity, Urine: 1.017 (ref 1.005–1.030)
pH: 6 (ref 5.0–8.0)

## 2023-06-27 LAB — CBC WITH DIFFERENTIAL/PLATELET
Abs Immature Granulocytes: 0.01 10*3/uL (ref 0.00–0.07)
Basophils Absolute: 0 10*3/uL (ref 0.0–0.1)
Basophils Relative: 1 %
Eosinophils Absolute: 0.2 10*3/uL (ref 0.0–0.5)
Eosinophils Relative: 2 %
HCT: 36.4 % (ref 36.0–46.0)
Hemoglobin: 12 g/dL (ref 12.0–15.0)
Immature Granulocytes: 0 %
Lymphocytes Relative: 30 %
Lymphs Abs: 2 10*3/uL (ref 0.7–4.0)
MCH: 30.2 pg (ref 26.0–34.0)
MCHC: 33 g/dL (ref 30.0–36.0)
MCV: 91.5 fL (ref 80.0–100.0)
Monocytes Absolute: 0.5 10*3/uL (ref 0.1–1.0)
Monocytes Relative: 7 %
Neutro Abs: 3.9 10*3/uL (ref 1.7–7.7)
Neutrophils Relative %: 60 %
Platelets: 230 10*3/uL (ref 150–400)
RBC: 3.98 MIL/uL (ref 3.87–5.11)
RDW: 13.3 % (ref 11.5–15.5)
WBC: 6.6 10*3/uL (ref 4.0–10.5)
nRBC: 0 % (ref 0.0–0.2)

## 2023-06-27 LAB — PROTIME-INR
INR: 1 (ref 0.8–1.2)
Prothrombin Time: 13.4 s (ref 11.4–15.2)

## 2023-06-27 LAB — LACTIC ACID, PLASMA: Lactic Acid, Venous: 1.9 mmol/L (ref 0.5–1.9)

## 2023-06-27 MED ORDER — DIPHENHYDRAMINE HCL 50 MG/ML IJ SOLN
25.0000 mg | Freq: Once | INTRAMUSCULAR | Status: AC
  Administered 2023-06-27: 25 mg via INTRAVENOUS
  Filled 2023-06-27: qty 1

## 2023-06-27 MED ORDER — SODIUM CHLORIDE 0.9 % IV SOLN
2.0000 g | Freq: Once | INTRAVENOUS | Status: AC
  Administered 2023-06-27: 2 g via INTRAVENOUS
  Filled 2023-06-27: qty 12.5

## 2023-06-27 MED ORDER — VANCOMYCIN HCL IN DEXTROSE 1-5 GM/200ML-% IV SOLN
1000.0000 mg | Freq: Once | INTRAVENOUS | Status: AC
  Administered 2023-06-27: 1000 mg via INTRAVENOUS
  Filled 2023-06-27: qty 200

## 2023-06-27 NOTE — Plan of Care (Signed)
 Plan of Care Note for accepted transfer   Patient name: Gabrielle Black UJW:[id number] DOB: 01/04/1991  Facility requesting transfer: [facility] AT [facility]  Requesting Provider: Denese Finn, PA-C  Reason for transfer: Left lower leg infected ulcer/ cellulitis Facility course: 33 year old female with history of ADHD, depression, IV drug abuse, HSV-2 infection, chronic low back pain presented to the ED for evaluation of worsening wound/ulcer to her left lower leg in the setting of injecting fentanyl and amphetamines into this area.  Recently seen at urgent care and was placed on doxycycline and Bactrim. Vital signs stable.  X-ray of the left tibia/fibula showing diffuse soft tissue swelling and no acute osseous abnormality.  Labs showing no leukocytosis or lactic acidosis.  Patient was given vancomycin and cefepime.  Plan of care: The patient is accepted for admission to Telemetry unit at [facility].  [facility] will assume care on arrival to accepting facility. Until arrival, care as per EDP. However, [facility] available 24/7 for questions and assistance.  Check [email or web address] for on-call coverage.  Nursing staff, please call [facility] Admits & Consults System-Wide number under Amion on patient's arrival so appropriate admitting provider can evaluate the pt.

## 2023-06-27 NOTE — ED Notes (Signed)
Unsuccessful attempt for second set of blood cultures.

## 2023-06-27 NOTE — ED Provider Notes (Signed)
 [place] [facility] DEPART[facility] AT [facility] Provider Note   CSN: [id number] Arrival date & time: 06/27/23  1924     History  Chief Complaint  Patient presents with   Wound Check    Gabrielle Black is a 33 y.o. female history of IV drug use presented with an infected ulcer since middle of April.  Patient states that she noticed after injecting fentanyl and amphetamines into her left lower leg.  Patient denies any fevers and has been on doxycycline and Bactrim however states that the redness is still there and that the ulcer is starting to get larger.  Patient's been walking and denies any leg pain or paresthesia new onset weakness or chest pain shortness of breath.  Patient states that she has not used IV drugs since then in that area.  Home Medications Prior to Admission medications   Medication Sig Start Date End Date Taking? Authorizing Provider  LUCEMYRA 0.18 MG TABS Take 1 tablet by mouth 4 (four) times daily as needed. 06/22/21   [provider]  naloxone Carrolyn Clan) nasal spray 4 mg/0.1 mL SMARTSIG:1 Spray(s) Both Nares As Directed PRN 06/15/21   [provider]      Allergies    Patient has no known allergies.    Review of Systems   Review of Systems  Physical Exam Updated Vital Signs BP 130/77 (BP Location: Left Arm)   Pulse 96   Temp 98.2 F (36.8 C)   Resp 16   Ht 5\' 4"  (1.626 m)   Wt 56.7 kg   SpO2 100%   BMI 21.46 kg/m  Physical Exam Vitals reviewed.  Constitutional:      General: She is not in acute distress. HENT:     Head: Normocephalic and atraumatic.  Eyes:     Extraocular Movements: Extraocular movements intact.     Conjunctiva/sclera: Conjunctivae normal.     Pupils: Pupils are equal, round, and reactive to light.  Cardiovascular:     Rate and Rhythm: Normal rate and regular rhythm.     Pulses: Normal pulses.     Heart sounds: Normal heart sounds.     Comments: 2+ bilateral radial/dorsalis pedis pulses with regular  rate Pulmonary:     Effort: Pulmonary effort is normal. No respiratory distress.     Breath sounds: Normal breath sounds.  Abdominal:     Palpations: Abdomen is soft.     Tenderness: There is no abdominal tenderness. There is no guarding or rebound.  Musculoskeletal:        General: Normal range of motion.     Cervical back: Normal range of motion and neck supple.     Comments: 5 out of 5 bilateral grip/leg extension strength  Skin:    Capillary Refill: Capillary refill takes less than 2 seconds.     Comments: 3 x 4 ulcer on the left anterior lower leg with surrounding erythema and warmth, I am unable to palpate any areas of fluctuance or express out any purulent material Does not appear necrotic to me  Neurological:     General: No focal deficit present.     Mental Status: She is alert and oriented to person, place, and time.     Comments: Sensation intact in all 4 limbs  Psychiatric:        Mood and Affect: Mood normal.     ED Results / Procedures / Treatments   Labs (all labs ordered are listed, but only abnormal results are displayed) Labs Reviewed  URINALYSIS,  W/ REFLEX TO CULTURE (INFECTION SUSPECTED) - Abnormal; Notable for the following components:      Result Value   APPearance HAZY (*)    Hgb urine dipstick TRACE (*)    Bacteria, UA RARE (*)    All other components within normal limits  CULTURE, BLOOD (ROUTINE X 2)  CULTURE, BLOOD (ROUTINE X 2)  COMPREHENSIVE METABOLIC PANEL WITH GFR  LACTIC ACID, PLASMA  CBC WITH DIFFERENTIAL/PLATELET  PROTIME-INR  PREGNANCY, URINE  LACTIC ACID, PLASMA    EKG None  Radiology DG Tibia/Fibula Left Result Date: 06/27/2023 CLINICAL DATA:  Pain EXAM: LEFT TIBIA AND FIBULA - 2 VIEW COMPARISON:  None FINDINGS: There is no evidence of fracture or other focal bone lesions. There is diffuse soft tissue swelling of the lower extremity. No dislocation. IMPRESSION: Diffuse soft tissue swelling of the lower extremity. No acute fracture or  dislocation. Electronically Signed   By: Tyron Gallon M.D.   On: 06/27/2023 20:24    Procedures Procedures    Medications Ordered in ED Medications  vancomycin (VANCOCIN) IVPB 1000 mg/200 mL premix (1,000 mg Intravenous New Bag/Given 06/27/23 2222)  ceFEPIme (MAXIPIME) 2 g in sodium chloride 0.9 % 100 mL IVPB (0 g Intravenous Stopped 06/27/23 2137)    ED Course/ Medical Decision Making/ A&P                                 Medical Decision Making Amount and/or Complexity of Data Reviewed Labs: ordered. Radiology: ordered.  Risk Prescription drug management. Decision regarding hospitalization.   Gabrielle Black 33 y.o. presented today for wound check. Working DDx that I considered at this time includes, but not limited to, cellulitis, necrotizing cellulitis, sepsis, neurovascular compromise, compartment syndrome.  R/o DDx: necrotizing cellulitis, sepsis, neurovascular compromise, compartment syndrome: These are considered less likely due to history of present illness, physical exam, labs/imaging findings  Review of prior external notes: 06/08/2023 office visit  Unique Tests and My Independent Interpretation:  CBC: Unremarkable CMP: Unremarkable Blood cultures: Pending UA: Unremarkable Urine pregnancy: Negative PT/INR: Unremarkable Lactic acid: Unremarkable Left tib-fib x-ray: Swelling however no signs of soft tissue gas is  Social Determinants of Health: EtOH/Substance Abuse  Discussion with Independent Historian: Significant other  Discussion of Management of Tests: [place], MD Hospitalist  Risk: High: hospitalization or escalation of hospital-level care  Risk Stratification Score: None  Plan: On exam patient was no acute distress with stable vitals.  On exam patient does have ulcer on the anterior portion of her left lower leg with surrounding erythema and warmth.  Patient is neuro vastly intact and not complaining of chest pain or shortness of breath.  Patient's been  on doxycycline and Bactrim however states that the ulcer is getting worse and so given the high risk nature will obtain labs, start IV antibiotics and plan for admission.  I spoke to the hospitalist and patient was accepted for admission.  Patient stable to be admitted.  This chart was dictated using voice recognition software.  Despite best efforts to proofread,  errors can occur which can change the documentation meaning.         Final Clinical Impression(s) / ED Diagnoses Final diagnoses:  Cellulitis of left lower extremity    Rx / DC Orders ED Discharge Orders     None         Elex Grimmer 06/27/23 2240    Scarlette Currier, MD 06/28/23 [phone]

## 2023-06-27 NOTE — ED Triage Notes (Signed)
 Pt states that she was injecting IV drugs into her L leg, went to UC and placed on doxycycline placed on antibiotics. Wound is not black and necrotic looking. Denies fevers at home.

## 2023-06-28 ENCOUNTER — Inpatient Hospital Stay (HOSPITAL_COMMUNITY): Payer: MEDICAID

## 2023-06-28 DIAGNOSIS — F1910 Other psychoactive substance abuse, uncomplicated: Secondary | ICD-10-CM | POA: Diagnosis present

## 2023-06-28 DIAGNOSIS — F1113 Opioid abuse with withdrawal: Secondary | ICD-10-CM | POA: Diagnosis present

## 2023-06-28 DIAGNOSIS — I96 Gangrene, not elsewhere classified: Secondary | ICD-10-CM | POA: Diagnosis present

## 2023-06-28 DIAGNOSIS — L03116 Cellulitis of left lower limb: Secondary | ICD-10-CM | POA: Diagnosis present

## 2023-06-28 DIAGNOSIS — F1990 Other psychoactive substance use, unspecified, uncomplicated: Secondary | ICD-10-CM | POA: Diagnosis not present

## 2023-06-28 DIAGNOSIS — Z8249 Family history of ischemic heart disease and other diseases of the circulatory system: Secondary | ICD-10-CM | POA: Diagnosis not present

## 2023-06-28 DIAGNOSIS — I959 Hypotension, unspecified: Secondary | ICD-10-CM | POA: Diagnosis not present

## 2023-06-28 DIAGNOSIS — L97929 Non-pressure chronic ulcer of unspecified part of left lower leg with unspecified severity: Secondary | ICD-10-CM | POA: Diagnosis present

## 2023-06-28 DIAGNOSIS — F1410 Cocaine abuse, uncomplicated: Secondary | ICD-10-CM | POA: Diagnosis present

## 2023-06-28 DIAGNOSIS — L0390 Cellulitis, unspecified: Secondary | ICD-10-CM | POA: Diagnosis present

## 2023-06-28 DIAGNOSIS — S81802A Unspecified open wound, left lower leg, initial encounter: Secondary | ICD-10-CM | POA: Diagnosis not present

## 2023-06-28 DIAGNOSIS — Z87891 Personal history of nicotine dependence: Secondary | ICD-10-CM | POA: Diagnosis not present

## 2023-06-28 LAB — COMPREHENSIVE METABOLIC PANEL WITH GFR
ALT: 14 U/L (ref 0–44)
AST: 18 U/L (ref 15–41)
Albumin: 3.7 g/dL (ref 3.5–5.0)
Alkaline Phosphatase: 35 U/L — ABNORMAL LOW (ref 38–126)
Anion gap: 9 (ref 5–15)
BUN: 9 mg/dL (ref 6–20)
CO2: 23 mmol/L (ref 22–32)
Calcium: 8.8 mg/dL — ABNORMAL LOW (ref 8.9–10.3)
Chloride: 107 mmol/L (ref 98–111)
Creatinine, Ser: 0.8 mg/dL (ref 0.44–1.00)
GFR, Estimated: >60 mL/min (ref >60)
Glucose, Bld: 108 mg/dL — ABNORMAL HIGH (ref 70–99)
Potassium: 3.3 mmol/L — ABNORMAL LOW (ref 3.5–5.1)
Sodium: 139 mmol/L (ref 135–145)
Total Bilirubin: 0.4 mg/dL (ref 0.0–1.2)
Total Protein: 6.6 g/dL (ref 6.5–8.1)

## 2023-06-28 LAB — CBC
HCT: 36 % (ref 36.0–46.0)
Hemoglobin: 11.5 g/dL — ABNORMAL LOW (ref 12.0–15.0)
MCH: 30.3 pg (ref 26.0–34.0)
MCHC: 31.9 g/dL (ref 30.0–36.0)
MCV: 95 fL (ref 80.0–100.0)
Platelets: 250 10*3/uL (ref 150–400)
RBC: 3.79 MIL/uL — ABNORMAL LOW (ref 3.87–5.11)
RDW: 13.3 % (ref 11.5–15.5)
WBC: 5.9 10*3/uL (ref 4.0–10.5)
nRBC: 0 % (ref 0.0–0.2)

## 2023-06-28 LAB — HEPATITIS PANEL, ACUTE
HCV Ab: NONREACTIVE
Hep A IgM: NONREACTIVE
Hep B C IgM: NONREACTIVE
Hepatitis B Surface Ag: NONREACTIVE

## 2023-06-28 LAB — MRSA NEXT GEN BY PCR, NASAL: MRSA by PCR Next Gen: NOT DETECTED

## 2023-06-28 LAB — RPR: RPR Ser Ql: NONREACTIVE

## 2023-06-28 LAB — RAPID URINE DRUG SCREEN, HOSP PERFORMED
Amphetamines: NOT DETECTED
Barbiturates: NOT DETECTED
Benzodiazepines: NOT DETECTED
Cocaine: POSITIVE — AB
Opiates: NOT DETECTED
Tetrahydrocannabinol: NOT DETECTED

## 2023-06-28 LAB — PHOSPHORUS: Phosphorus: 3.8 mg/dL (ref 2.5–4.6)

## 2023-06-28 LAB — LACTIC ACID, PLASMA: Lactic Acid, Venous: 1.2 mmol/L (ref 0.5–1.9)

## 2023-06-28 LAB — HIV ANTIBODY (ROUTINE TESTING W REFLEX): HIV Screen 4th Generation wRfx: NONREACTIVE

## 2023-06-28 LAB — MAGNESIUM: Magnesium: 2.1 mg/dL (ref 1.7–2.4)

## 2023-06-28 MED ORDER — LOPERAMIDE HCL 2 MG PO CAPS: 2.0000 mg | ORAL_CAPSULE | ORAL | Status: AC | PRN

## 2023-06-28 MED ORDER — ONDANSETRON HCL 4 MG/2ML IJ SOLN: 4.0000 mg | Freq: Four times a day (QID) | INTRAMUSCULAR | Status: DC | PRN

## 2023-06-28 MED ORDER — SODIUM CHLORIDE 0.9 % IV SOLN: INTRAVENOUS | Status: AC

## 2023-06-28 MED ORDER — DICYCLOMINE HCL 20 MG PO TABS: 20.0000 mg | ORAL_TABLET | Freq: Four times a day (QID) | ORAL | Status: AC | PRN

## 2023-06-28 MED ORDER — HYDROXYZINE HCL 25 MG PO TABS
25.0000 mg | ORAL_TABLET | Freq: Four times a day (QID) | ORAL | Status: AC | PRN
  Administered 2023-06-28 (×2): 25 mg via ORAL
  Filled 2023-06-28 (×2): qty 1

## 2023-06-28 MED ORDER — BUPRENORPHINE HCL-NALOXONE HCL 8-2 MG SL SUBL
1.0000 | SUBLINGUAL_TABLET | Freq: Two times a day (BID) | SUBLINGUAL | Status: DC
  Filled 2023-06-28 (×3): qty 1

## 2023-06-28 MED ORDER — CLONIDINE HCL 0.1 MG PO TABS
0.1000 mg | ORAL_TABLET | Freq: Every day | ORAL | Status: DC
  Administered 2023-07-03: 0.1 mg via ORAL
  Filled 2023-06-28: qty 1

## 2023-06-28 MED ORDER — MEDIHONEY WOUND/BURN DRESSING EX PSTE
1.0000 | PASTE | Freq: Every day | CUTANEOUS | Status: DC
Start: 2023-06-28 — End: 2023-07-03
  Administered 2023-06-28 – 2023-07-01 (×2): 1 via TOPICAL
  Filled 2023-06-28: qty 44

## 2023-06-28 MED ORDER — CLONIDINE HCL 0.1 MG PO TABS
0.1000 mg | ORAL_TABLET | ORAL | Status: AC
  Administered 2023-06-30 – 2023-07-01 (×2): 0.1 mg via ORAL
  Filled 2023-06-28 (×4): qty 1

## 2023-06-28 MED ORDER — BUPRENORPHINE HCL-NALOXONE HCL 8-2 MG SL SUBL
1.0000 | SUBLINGUAL_TABLET | Freq: Two times a day (BID) | SUBLINGUAL | Status: DC
Start: 2023-06-28 — End: 2023-06-28

## 2023-06-28 MED ORDER — METHOCARBAMOL 500 MG PO TABS: 500.0000 mg | ORAL_TABLET | Freq: Three times a day (TID) | ORAL | Status: AC | PRN

## 2023-06-28 MED ORDER — NALOXONE HCL 0.4 MG/ML IJ SOLN: 0.4000 mg | INTRAMUSCULAR | Status: DC | PRN

## 2023-06-28 MED ORDER — TRAMADOL HCL 50 MG PO TABS
50.0000 mg | ORAL_TABLET | Freq: Four times a day (QID) | ORAL | Status: DC | PRN
  Administered 2023-06-28 – 2023-07-02 (×2): 50 mg via ORAL
  Filled 2023-06-28 (×2): qty 1

## 2023-06-28 MED ORDER — ENOXAPARIN SODIUM 40 MG/0.4ML IJ SOSY
40.0000 mg | PREFILLED_SYRINGE | INTRAMUSCULAR | Status: DC
  Administered 2023-06-28 – 2023-07-01 (×4): 40 mg via SUBCUTANEOUS
  Filled 2023-06-28 (×6): qty 0.4

## 2023-06-28 MED ORDER — GADOBUTROL 1 MMOL/ML IV SOLN
6.0000 mL | Freq: Once | INTRAVENOUS | Status: AC | PRN
  Administered 2023-06-28: 6 mL via INTRAVENOUS

## 2023-06-28 MED ORDER — SODIUM CHLORIDE 0.9 % IV SOLN
2.0000 g | Freq: Three times a day (TID) | INTRAVENOUS | Status: DC
  Administered 2023-06-28 – 2023-07-03 (×16): 2 g via INTRAVENOUS
  Filled 2023-06-28 (×16): qty 12.5

## 2023-06-28 MED ORDER — BUPRENORPHINE HCL-NALOXONE HCL 2-0.5 MG SL SUBL: 2.0000 | SUBLINGUAL_TABLET | SUBLINGUAL | Status: AC | PRN

## 2023-06-28 MED ORDER — NAPROXEN 500 MG PO TABS: 500.0000 mg | ORAL_TABLET | Freq: Two times a day (BID) | ORAL | Status: AC | PRN

## 2023-06-28 MED ORDER — VANCOMYCIN HCL 750 MG/150ML IV SOLN
750.0000 mg | Freq: Two times a day (BID) | INTRAVENOUS | Status: DC
  Administered 2023-06-28 – 2023-07-03 (×11): 750 mg via INTRAVENOUS
  Filled 2023-06-28 (×11): qty 150

## 2023-06-28 MED ORDER — ACETAMINOPHEN 325 MG PO TABS
650.0000 mg | ORAL_TABLET | Freq: Four times a day (QID) | ORAL | Status: DC | PRN
  Administered 2023-07-02: 650 mg via ORAL
  Filled 2023-06-28: qty 2

## 2023-06-28 MED ORDER — ACETAMINOPHEN 650 MG RE SUPP: 650.0000 mg | Freq: Four times a day (QID) | RECTAL | Status: DC | PRN

## 2023-06-28 MED ORDER — VANCOMYCIN HCL IN DEXTROSE 750-5 MG/150ML-% IV SOLN
750.0000 mg | Freq: Two times a day (BID) | INTRAVENOUS | Status: DC
  Filled 2023-06-28: qty 150

## 2023-06-28 MED ORDER — ONDANSETRON HCL 4 MG PO TABS: 4.0000 mg | ORAL_TABLET | Freq: Four times a day (QID) | ORAL | Status: DC | PRN

## 2023-06-28 MED ORDER — CLONIDINE HCL 0.1 MG PO TABS
0.1000 mg | ORAL_TABLET | Freq: Four times a day (QID) | ORAL | Status: AC
  Administered 2023-06-28 – 2023-06-29 (×6): 0.1 mg via ORAL
  Filled 2023-06-28 (×7): qty 1

## 2023-06-28 MED ORDER — BUPRENORPHINE HCL-NALOXONE HCL 8-2 MG SL SUBL: 1.0000 | SUBLINGUAL_TABLET | Freq: Two times a day (BID) | SUBLINGUAL | Status: DC

## 2023-06-28 NOTE — Hospital Course (Addendum)
 Gabrielle Black is a 33 y.o. female with a history of ADHD and drug abuse.  Patient presented secondary to left lower leg wound check with evidence of cellulitis and central necrosis of wound. IV antibiotics started. Orthopedic surgery consulted with recommendation for plastic surgery consultation. Plastic surgery plans for debridement of wound.

## 2023-06-28 NOTE — Consult Note (Addendum)
 WOC Nurse Consult Note: Reason for Consult: Consult requested for left leg wounds.  Pt admits to recreational drug use.  Appearance of wound is consistent with possible "TRANQ" exposure; this is an additive in some street drugs which causes wounds with tissue necrosis. Pt agrees this could be a possibility.  Wound type: Left lower anterior calf with 2 areas of full thickness wounds, 1X1cm, dry yellow scabbed and 3X4cm tightly adhered black dry eschar surrounded by generalized edema and erythremia. The areas are separated by a narrow bridge of intact skin. Pt is on systmeic antibiotics to treat cellulitis. MRI is pending to r/o a possible abscess.  Pt could benefit from possible bedside debridement with a local anesthetic to remove outer nonviable tissue; if this is desired, please consult the ortho or surgical team to perform.  Dressing procedure/placement/frequency: Until further plan of care is determined, topical treatment orders provided for bedside nurses to perform to assist with removal of nonviable tissue as follows:  Apply Medihoney to left leg wounds Q day, then cover with foam dressing. Change foam dressing Q 3 days or PRN soiling. Please re-consult if further assistance is needed.  Thank-you,  Wiliam Harder MSN, RN, CWOCN, [place], CNS [phone]

## 2023-06-28 NOTE — Assessment & Plan Note (Addendum)
 Transitional care consult Spoke about importance of cessation Blood cultures pending Check HIV and hepatitis status and RPR  Buprenorphine protocol  ordered

## 2023-06-28 NOTE — Assessment & Plan Note (Signed)
-  admit per  cellulitis protocol will   continue current antibiotic choice cefepime and vancomycin      plain films showed:   no evidence of air  no evidence of osteomyelitis   no               foreign   objects   Will obtain MRSA screening,       obtain blood cultures  if febrile or septic     further antibiotic adjustment pending above results

## 2023-06-28 NOTE — TOC Initial Note (Addendum)
 Transition of Care [facility]) - Initial/Assessment Note    Patient Details  Name: Gabrielle Black MRN: [id number] Date of Birth: 21-Nov-1990  Transition of Care [facility]) CM/SW Contact:    Gabrielle Ream, RN Phone Number: 06/28/2023, 10:32 AM  Clinical Narrative:                 [facility] consult for SA counseling/education; no PCP listed; spoke w/ pt in room; pt says she lives at home w/ family; she plans to return at d/c; pt says she will arrange transportation; pt verified insurance; she says her PCP is Gabrielle Black in [place]; pt denies SDOH risks; pt says she does not have DME, HH services, and home oxygen; pt agreed to receive resources for SA; resources, signs of overdose, and [facility] in [place] placed in d/c instructions; copies of resources also given to pt; she will make appt at agency of choice; TOC is following.    Expected Discharge Plan: Home/Self Care Barriers to Discharge: Continued Medical Work up   Patient Goals and CMS Choice Patient states their goals for this hospitalization and ongoing recovery are:: home          Expected Discharge Plan and Services   Discharge Planning Services: CM Consult   Living arrangements for the past 2 months: Single Family Home                                      Prior Living Arrangements/Services Living arrangements for the past 2 months: Single Family Home Lives with:: Relatives Patient language and need for interpreter reviewed:: Yes Do you feel safe going back to the place where you live?: Yes      Need for Family Participation in Patient Care: Yes (Comment) Care giver support system in place?: Yes (comment) Current home services:  (n/a) Criminal Activity/Legal Involvement Pertinent to Current Situation/Hospitalization: No - Comment as needed  Activities of Daily Living   ADL Screening (condition at time of admission) Independently performs ADLs?: Yes (appropriate for developmental age) Is the patient deaf  or have difficulty hearing?: No Does the patient have difficulty seeing, even when wearing glasses/contacts?: No Does the patient have difficulty concentrating, remembering, or making decisions?: No  Permission Sought/Granted Permission sought to share information with : Case Manager Permission granted to share information with : Yes, Verbal Permission Granted  Share Information with NAME: Case Manager     Permission granted to share info w Relationship: Gabrielle Black (Significant Other) [phone]     Emotional Assessment Appearance:: Appears stated age Attitude/Demeanor/Rapport: Gracious Affect (typically observed): Accepting Orientation: : Oriented to Self, Oriented to Place, Oriented to  Time, Oriented to Situation Alcohol / Substance Use: Illicit Drugs Psych Involvement: No (comment)  Admission diagnosis:  Cellulitis [L03.90] Cellulitis of left lower extremity [L03.116] Patient Active Problem List   Diagnosis Date Noted   IV drug user    Cellulitis 06/27/2023   PCP:  Patient, No Pcp Per Pharmacy:   [facility] [phone] - [place], [place] - [address] [place] [address] Phone: [phone] Fax: [phone]     Social Drivers of Health (SDOH) Social History: SDOH Screenings   Food Insecurity: No Food Insecurity (06/28/2023)  Housing: Low Risk  (06/28/2023)  Transportation Needs: No Transportation Needs (06/28/2023)  Utilities: Not At Risk (06/28/2023)  Depression (PHQ2-9): Medium Risk (07/19/2021)  Tobacco Use: Medium Risk (06/27/2023)   SDOH Interventions:  Food Insecurity Interventions: Intervention Not Indicated, Inpatient TOC Housing Interventions: Intervention Not Indicated, Inpatient TOC Transportation Interventions: Intervention Not Indicated, Inpatient TOC Utilities Interventions: Intervention Not Indicated, Inpatient TOC   Readmission Risk Interventions     No data to display

## 2023-06-28 NOTE — Progress Notes (Signed)
 Pharmacy Antibiotic Note  Gabrielle Black is a 33 y.o. female admitted on 06/27/2023 with LLE cellulitis & ulcers.  Pharmacy has been consulted for Cefepime + Vancomycin dosing.  Plan: Cefepime 2gm IV q8h Vancomycin 750mg  IV q12h to target AUC 400-550.   Estimated AUC on this regimen = 486 Monitor renal function and cx data  Check levels as needed   Height: 5\' 4"  (162.6 cm) Weight: 56.7 kg (125 lb) IBW/kg (Calculated) : 54.7  Temp (24hrs), Avg:98.2 F (36.8 C), Min:98.2 F (36.8 C), Max:98.3 F (36.8 C)  Recent Labs  Lab 06/27/23 2012  WBC 6.6  CREATININE 0.82  LATICACIDVEN 1.9    Estimated Creatinine Clearance: 85.8 mL/min (by C-G formula based on SCr of 0.82 mg/dL).    No Known Allergies  Antimicrobials this admission: 5/7 Cefepime >>  5/7 Vancomycin >>   Dose adjustments this admission:  Microbiology results: 5/7 BCx:   Thank you for allowing pharmacy to be a part of this patient's care.  Arie Kurtz PharmD 06/28/2023 12:57 AM

## 2023-06-28 NOTE — Subjective & Objective (Signed)
 Patient presents with  infected ulcer in left leg she is admitting to using IV drugs went to urgent care start doxycycline she also tried Bactrim for wound and infection.  No fevers or chills She has been having an ulcer since mid April started after injecting fentanyl and amphetamines in her left lower leg No associated fever Has persistent redness ulcer somewhat enlarging

## 2023-06-28 NOTE — Discharge Instructions (Addendum)
 Gabrielle Black,  You were in the hospital because of a leg infection. This was managed with antibiotics and you also required surgery to remove dead tissue. Please continue your antibiotics and follow-up with the surgeon so your wound can be reevaluated.

## 2023-06-28 NOTE — Progress Notes (Signed)
 PROGRESS NOTE    Gabrielle Black  AVW:[id number] DOB: 1990-09-18 DOA: 06/27/2023 PCP: Patient, No Pcp Per   Brief Narrative: Gabrielle Black is a 33 y.o. female with a history of ADHD and drug abuse.  Patient presented secondary to left lower leg wound check with evidence of cellulitis and central necrosis of wound. IV antibiotics started. Orthopedic surgery consulted.   Assessment and Plan:  Necrotic leg wound Cellulitis Wound secondary to IV injection.  Patient started empirically on vancomycin and cefepime on admission. Orthopedic surgery consulted, Dr. Cherl Corner, who recommended MRI and wound care consult. -Continue Vancomycin and Cefepime -Follow-up MRI and orthopedic surgery recommendations  IV drug use Patient admits to using Fentanyl, amphetamine, cocaine use.  UDS positive for cocaine.  Concern for withdrawal symptoms precipitating while admitted. Patient with plans to attend outpatient rehabilitation. Suboxone started for withdrawal symptoms. -Suboxone once withdrawal symptoms start -Clonidine, Robaxin , Atarax, Naproxen, Bentyl, Imodium   DVT prophylaxis: Lovenox Code Status:   Code Status: Full Code Family Communication: Boyfriend at bedside Disposition Plan: Discharge pending ongoing specialist recommendations and outpatient antibiotics   Consultants:  Orthopedic surgery  Procedures:  None  Antimicrobials: Vancomycin Cefepime    Subjective: Patient reports no issues overnight.  Objective: BP 106/89 (BP Location: Right Arm)   Pulse 87   Temp 98.8 F (37.1 C) (Oral)   Resp 16   Ht 5\' 4"  (1.626 m)   Wt 56.7 kg   SpO2 100%   BMI 21.46 kg/m   Examination:  General exam: Appears calm and comfortable Respiratory system: Clear to auscultation. Respiratory effort normal. Cardiovascular system: S1 & S2 heard, RRR. No murmurs, rubs, gallops or clicks. Gastrointestinal system: Abdomen is nondistended, soft and nontender. Normal bowel sounds heard. Central  nervous system: Alert and oriented. No focal neurological deficits. Musculoskeletal: No edema. No calf tenderness Skin: Left lower leg with irregular wound and central necrosis Psychiatry: Judgement and insight appear normal. Mood & affect appropriate.    Data Reviewed: I have personally reviewed following labs and imaging studies  CBC Lab Results  Component Value Date   WBC 5.9 06/28/2023   RBC 3.79 (L) 06/28/2023   HGB 11.5 (L) 06/28/2023   HCT 36.0 06/28/2023   MCV 95.0 06/28/2023   MCH 30.3 06/28/2023   PLT 250 06/28/2023   MCHC 31.9 06/28/2023   RDW 13.3 06/28/2023   LYMPHSABS 2.0 06/27/2023   MONOABS 0.5 06/27/2023   EOSABS 0.2 06/27/2023   BASOSABS 0.0 06/27/2023     Last metabolic panel Lab Results  Component Value Date   NA 139 06/28/2023   K 3.3 (L) 06/28/2023   CL 107 06/28/2023   CO2 23 06/28/2023   BUN 9 06/28/2023   CREATININE 0.80 06/28/2023   GLUCOSE 108 (H) 06/28/2023   GFRNONAA >60 06/28/2023   GFRAA 144 10/23/2019   CALCIUM 8.8 (L) 06/28/2023   PHOS 3.8 06/28/2023   PROT 6.6 06/28/2023   ALBUMIN 3.7 06/28/2023   LABGLOB 2.1 07/19/2021   AGRATIO 2.0 07/19/2021   BILITOT 0.4 06/28/2023   ALKPHOS 35 (L) 06/28/2023   AST 18 06/28/2023   ALT 14 06/28/2023   ANIONGAP 9 06/28/2023    GFR: Estimated Creatinine Clearance: 88 mL/min (by C-G formula based on SCr of 0.8 mg/dL).  Recent Results (from the past 240 hours)  Culture, blood (Routine x 2)     Status: None (Preliminary result)   Collection Time: 06/27/23  8:00 PM   Specimen: BLOOD LEFT FOREARM  Result Value Ref Range Status  Specimen Description   Final    BLOOD LEFT FOREARM Performed at [facility], [address], [place], [place] [address]    Special Requests   Final    BOTTLES DRAWN AEROBIC AND ANAEROBIC Blood Culture results may not be optimal due to an inadequate volume of blood received in culture bottles Performed at [facility], [address] [place], [place] [address]    Culture PENDING  Incomplete   Report Status PENDING  Incomplete  MRSA Next Gen by PCR, Nasal     Status: None   Collection Time: 06/28/23  1:55 AM   Specimen: Nasal Mucosa; Nasal Swab  Result Value Ref Range Status   MRSA by PCR Next Gen NOT DETECTED NOT DETECTED Final    Comment: (NOTE) The GeneXpert MRSA Assay (FDA approved for NASAL specimens only), is one component of a comprehensive MRSA colonization surveillance program. It is not intended to diagnose MRSA infection nor to guide or monitor treatment for MRSA infections. Test performance is not FDA approved in patients less than 71 years old. Performed at [facility], [address], [place], [place] [address]       Radiology Studies: DG Tibia/Fibula Left Result Date: 06/27/2023 CLINICAL DATA:  Pain EXAM: LEFT TIBIA AND FIBULA - 2 VIEW COMPARISON:  None FINDINGS: There is no evidence of fracture or other focal bone lesions. There is diffuse soft tissue swelling of the lower extremity. No dislocation. IMPRESSION: Diffuse soft tissue swelling of the lower extremity. No acute fracture or dislocation. Electronically Signed   By: Tyron Gallon M.D.   On: 06/27/2023 20:24      LOS: 0 days    Aneita Keens, MD [facility] 06/28/2023, 10:13 AM   If 7PM-7AM, please contact night-coverage [email or web address]

## 2023-06-28 NOTE — Plan of Care (Signed)

## 2023-06-28 NOTE — Progress Notes (Addendum)
   06/28/23 1127  Spiritual Encounters  Type of Visit Initial  Care provided to: Patient  Conversation partners present during encounter Nurse  Reason for visit Routine spiritual support  OnCall Visit No   Visited with patient for a spiritual assessment. Patient very pleasant and open. Brief conversation as patient had a guest sleeping in the room. Patient was also in immediate need by wound care, therefore visit cut short. Advised patient a chaplain would return to continue. Patient very welcoming and open to visits.     Update: returned to patient once interdisciplinary team finished. Talked with patient further. Discussed plans, goals and fears. Patient fears going into withdrawal symptoms here at the hospital before going to rehab. Patient stated that she was on waiting list for rehab and a spot became available but she had to cancel due to hospitalization. Patient states she will call them and reschedule however patient didn't want to make call in presence of chaplain.  Patient also expressed interest in support groups. Patient alleges her home is safe from influence. Spiritual assessment complete.

## 2023-06-28 NOTE — H&P (Signed)
 Gabrielle Black ZOX:[id number] DOB: 1990/12/26 DOA: 06/27/2023     PCP: Patient, No Pcp Per    Patient arrived to ER on 06/27/23 at 1924 Referred by Attending Selene Dais, MD   Patient coming from:    home Lives With family     Chief Complaint:   Chief Complaint  Patient presents with   Wound Check    HPI: Gabrielle Black is a 33 y.o. female with medical history significant of ADHD and drug abuse    Presented with left lower extremity wound after injecting drugs Patient presents with  infected ulcer in left leg she is admitting to using IV drugs went to urgent care start doxycycline she also tried Bactrim for wound and infection.  No fevers or chills She has been having an ulcer since mid April started after injecting fentanyl and amphetamines in her left lower leg No associated fever Has persistent redness ulcer somewhat enlarging  States she uses fentanyl every day says uses about a gram Does use crack cocaine too   Last IV drug use was 7 pm   Denies significant ETOH intake  vapes  Denies marijuana use      While in ER:     Started on cefepime and vancomycin plain imaging showed no evidence of osteomyelitis  LEFT TIBIA AND FIBULA  Diffuse soft tissue swelling of the lower extremity. No acute fracture or dislocation.    Lab Orders         Culture, blood (Routine x 2)         MRSA Next Gen by PCR, Nasal         Comprehensive metabolic panel         Lactic acid, plasma         CBC with Differential         Protime-INR         Urinalysis, w/ Reflex to Culture (Infection Suspected) -Urine, Clean Catch         Pregnancy, urine         HIV Antibody (routine testing w rflx)         Magnesium         Phosphorus         Comprehensive metabolic panel         CBC         Hepatitis panel, acute       Following Medications were ordered in ER: Medications  0.9 %  sodium chloride infusion (has no administration in time range)  acetaminophen (TYLENOL) tablet 650 mg  (has no administration in time range)    Or  acetaminophen (TYLENOL) suppository 650 mg (has no administration in time range)  ondansetron (ZOFRAN) tablet 4 mg (has no administration in time range)    Or  ondansetron (ZOFRAN) injection 4 mg (has no administration in time range)  enoxaparin (LOVENOX) injection 40 mg (has no administration in time range)  traMADol (ULTRAM) tablet 50 mg (has no administration in time range)  vancomycin (VANCOCIN) IVPB 1000 mg/200 mL premix (0 mg Intravenous Stopped 06/27/23 2326)  ceFEPIme (MAXIPIME) 2 g in sodium chloride 0.9 % 100 mL IVPB (0 g Intravenous Stopped 06/27/23 2137)  diphenhydrAMINE (BENADRYL) injection 25 mg (25 mg Intravenous Given 06/27/23 2312)       ED Triage Vitals  Encounter Vitals Group     BP 06/27/23 1938 130/77     Systolic BP Percentile --      Diastolic BP Percentile --  Pulse Rate 06/27/23 1938 96     Resp 06/27/23 1938 16     Temp 06/27/23 1938 98.2 F (36.8 C)     Temp Source 06/27/23 2338 Oral     SpO2 06/27/23 1938 100 %     Weight 06/27/23 1942 125 lb (56.7 kg)     Height 06/27/23 1942 5\' 4"  (1.626 m)     Head Circumference --      Peak Flow --      Pain Score 06/27/23 1942 7     Pain Loc --      Pain Education --      Exclude from Growth Chart --   ZOXW(96)@     _________________________________________ Significant initial  Findings: Abnormal Labs Reviewed  URINALYSIS, W/ REFLEX TO CULTURE (INFECTION SUSPECTED) - Abnormal; Notable for the following components:      Result Value   APPearance HAZY (*)    Hgb urine dipstick TRACE (*)    Bacteria, UA RARE (*)    All other components within normal limits         The recent clinical data is shown below. Vitals:   06/27/23 1942 06/27/23 2130 06/27/23 2338 06/28/23 0038  BP:  105/66  (!) 114/51  Pulse:  83  72  Resp:  18  16  Temp:   98.3 F (36.8 C) 98.2 F (36.8 C)  TempSrc:   Oral Oral  SpO2:  100%  100%  Weight: 56.7 kg     Height: 5\' 4"  (1.626 m)        WBC     Component Value Date/Time   WBC 6.6 06/27/2023 2012   LYMPHSABS 2.0 06/27/2023 2012   LYMPHSABS 2.0 07/19/2021 1432   MONOABS 0.5 06/27/2023 2012   EOSABS 0.2 06/27/2023 2012   EOSABS 0.3 07/19/2021 1432   BASOSABS 0.0 06/27/2023 2012   BASOSABS 0.0 07/19/2021 1432    Lactic Acid, Venous    Component Value Date/Time   LATICACIDVEN 1.9 06/27/2023 2012      UA   no evidence of UTI     Urine analysis:    Component Value Date/Time   COLORURINE YELLOW 06/27/2023 2224   APPEARANCEUR HAZY (A) 06/27/2023 2224   APPEARANCEUR Clear 07/19/2021 1401   LABSPEC 1.017 06/27/2023 2224   PHURINE 6.0 06/27/2023 2224   GLUCOSEU NEGATIVE 06/27/2023 2224   HGBUR TRACE (A) 06/27/2023 2224   BILIRUBINUR NEGATIVE 06/27/2023 2224   BILIRUBINUR Negative 07/19/2021 1401   KETONESUR NEGATIVE 06/27/2023 2224   PROTEINUR NEGATIVE 06/27/2023 2224   NITRITE NEGATIVE 06/27/2023 2224   LEUKOCYTESUR NEGATIVE 06/27/2023 2224    Results for orders placed or performed in visit on 09/10/20  Urine Culture     Status: Abnormal   Collection Time: 09/10/20  4:16 PM   Specimen: Urine   UR  Result Value Ref Range Status   Urine Culture, Routine Final report (A)  Final   Organism ID, Bacteria Comment (A)  Final    Comment: Escherichia coli, identified by an automated biochemical system. Multi-Drug Resistant Organism Greater than 100,000 colony forming units per mL    Antimicrobial Susceptibility Comment  Final    Comment:       ** S = Susceptible; I = Intermediate; R = Resistant **                    P = Positive; N = Negative             MICS are  expressed in micrograms per mL    Antibiotic                 RSLT#1    RSLT#2    RSLT#3    RSLT#4 Amoxicillin /Clavulanic Acid    S Ampicillin                     R Cefepime                       S Ceftriaxone                    S Cefuroxime                     R Ciprofloxacin                  S Ertapenem                      S Gentamicin                      R Imipenem                       S Levofloxacin                   S Meropenem                      S Nitrofurantoin                  S Piperacillin/Tazobactam        S Tetracycline                   R Tobramycin                     I Trimethoprim/Sulfa             R     ABX started Antibiotics Given (last 72 hours)     Date/Time Action Medication Dose Rate   06/27/23 2107 New Bag/Given   ceFEPIme (MAXIPIME) 2 g in sodium chloride 0.9 % 100 mL IVPB 2 g 200 mL/hr   06/27/23 2222 New Bag/Given   vancomycin (VANCOCIN) IVPB 1000 mg/200 mL premix 1,000 mg 200 mL/hr      __________________________________________________ Recent Labs  Lab 06/27/23 2012  NA 139  K 4.0  CO2 25  GLUCOSE 77  BUN 10  CREATININE 0.82  CALCIUM 10.0    Cr   stable,    Lab Results  Component Value Date   CREATININE 0.82 06/27/2023   CREATININE 0.69 07/19/2021   CREATININE 0.61 10/23/2019    Recent Labs  Lab 06/27/23 2012  AST 23  ALT 13  ALKPHOS 49  BILITOT 0.4  PROT 7.4  ALBUMIN 4.8   Lab Results  Component Value Date   CALCIUM 10.0 06/27/2023    Plt: Lab Results  Component Value Date   PLT 230 06/27/2023    Recent Labs  Lab 06/27/23 2012  WBC 6.6  NEUTROABS 3.9  HGB 12.0  HCT 36.4  MCV 91.5  PLT 230    HG/HCT   stable,      Component Value Date/Time   HGB 12.0 06/27/2023 2012   HGB 12.2 07/19/2021 1432   HCT 36.4 06/27/2023 2012   HCT 36.9 07/19/2021 1432   MCV 91.5  06/27/2023 2012   MCV 90 07/19/2021 1432    ________________________________ Hospitalist was called for admission for   Cellulitis of left lower extremity     The following Work up has been ordered so far:  Orders Placed This Encounter  Procedures   Culture, blood (Routine x 2)   MRSA Next Gen by PCR, Nasal   DG Tibia/Fibula Left   Comprehensive metabolic panel   Lactic acid, plasma   CBC with Differential   Protime-INR   Urinalysis, w/ Reflex to Culture (Infection Suspected)  -Urine, Clean Catch   Pregnancy, urine   HIV Antibody (routine testing w rflx)   Magnesium   Phosphorus   Comprehensive metabolic panel   CBC   Hepatitis panel, acute   Diet regular Room service appropriate? Yes; Fluid consistency: Thin   Notify physician (specify)  Specify: Notify provider for possible Code Sepsis   Document height and weight   ED Cardiac monitoring   Cardiac Monitoring Continuous x 24 hours Indications for use: Other; Other indications for use: Close monitoring   Patient has an active order for admit to inpatient/place in observation   Vital signs   Notify physician (specify)   Mobility Protocol: No Restrictions RN to initiate protocols based on patient's level of care   Refer to Sidebar Report Refer to ICU, Med-Surg, Progressive, and Step-Down Mobility Protocol Sidebars   Initiate Adult Central Line Maintenance and Catheter Protocol for patients with central line (CVC, PICC, Port, Hemodialysis, Trialysis)   If patient diabetic or glucose greater than 140 notify physician for Sliding Scale Insulin Orders   Do not place and if present remove PureWick   Oral care per nursing protocol   Initiate Oral Care Protocol   Initiate Carrier Fluid Protocol   Assess   Check Pulse Oximetry while ambulating   Place order for blood cultures x 2 (from different sites) for Temp > 101F   RN may order General Admission PRN Orders utilizing "General Admission PRN medications" (through manage orders) for the following patient needs: allergy symptoms (Claritin), cold sores (Carmex), cough (Robitussin DM), eye irritation (Liquifilm Tears), hemorrhoids (Tucks), indigestion (Maalox), minor skin irritation (Hydrocortisone Cream), muscle pain Lovena Rubinstein Gay), nose irritation (saline nasal spray) and sore throat (Chloraseptic spray).   Apply Cellulitis Care Plan   Cardiac Monitoring Continuous x 24 hours Indications for use: Other; other indications for use: infection   Full code   Consult to  hospitalist   Consult to hospitalist   CeFEPIme (MAXIPIME) per pharmacy consult            vancomycin per pharmacy consult   Consult to Transition of Care Team   vancomycin per pharmacy consult   ceFEPime (MAXIPIME) per pharmacy consult            May transport without cardiac monitor   Pulse oximetry check with vital signs   Oxygen therapy Mode or (Route): Nasal cannula; Liters Per Minute: 2; Keep O2 saturation between: greater than 92 %   Incentive spirometry   Pulse oximetry, continuous   ED EKG   EKG 12-Lead   Admit to Inpatient (patient's expected length of stay will be greater than 2 midnights or inpatient only procedure)     OTHER Significant initial  Findings:  labs showing:     DM  labs:  HbA1C: No results for input(s): "HGBA1C" in the last 8760 hours.     CBG (last 3)  No results for input(s): "GLUCAP" in the last 72 hours.  Cultures: No results found for: "SDES", "SPECREQUEST", "CULT", "REPTSTATUS"   Radiological Exams on Admission: DG Tibia/Fibula Left Result Date: 06/27/2023 CLINICAL DATA:  Pain EXAM: LEFT TIBIA AND FIBULA - 2 VIEW COMPARISON:  None FINDINGS: There is no evidence of fracture or other focal bone lesions. There is diffuse soft tissue swelling of the lower extremity. No dislocation. IMPRESSION: Diffuse soft tissue swelling of the lower extremity. No acute fracture or dislocation. Electronically Signed   By: Tyron Gallon M.D.   On: 06/27/2023 20:24   _______________________________________________________________________________________________________ Latest  Blood pressure (!) 114/51, pulse 72, temperature 98.2 F (36.8 C), temperature source Oral, resp. rate 16, height 5\' 4"  (1.626 m), weight 56.7 kg, SpO2 100%.   Vitals  labs and radiology finding personally reviewed  Review of Systems:    Pertinent positives include:  , fatigue, Left leg wound  Constitutional:  No weight loss, night sweats, Fevers, chills weight loss  HEENT:  No  headaches, Difficulty swallowing,Tooth/dental problems,Sore throat,  No sneezing, itching, ear ache, nasal congestion, post nasal drip,  Cardio-vascular:  No chest pain, Orthopnea, PND, anasarca, dizziness, palpitations.no Bilateral lower extremity swelling  GI:  No heartburn, indigestion, abdominal pain, nausea, vomiting, diarrhea, change in bowel habits, loss of appetite, melena, blood in stool, hematemesis Resp:  no shortness of breath at rest. No dyspnea on exertion, No excess mucus, no productive cough, No non-productive cough, No coughing up of blood.No change in color of mucus.No wheezing. Skin:  no rash or lesions. No jaundice GU:  no dysuria, change in color of urine, no urgency or frequency. No straining to urinate.  No flank pain.  Musculoskeletal:  No joint pain or no joint swelling. No decreased range of motion. No back pain.  Psych:  No change in mood or affect. No depression or anxiety. No memory loss.  Neuro: no localizing neurological complaints, no tingling, no weakness, no double vision, no gait abnormality, no slurred speech, no confusion  All systems reviewed and apart from HOPI all are negative _______________________________________________________________________________________________ Past Medical History:   Past Medical History:  Diagnosis Date   ADHD    Current mild episode of major depressive disorder without prior episode (HCC) 09/11/2019   HSV-2 (herpes simplex virus 2) infection    IV drug user       History reviewed. No pertinent surgical history.  Social History:  Ambulatory   independently       reports that she has quit smoking. Her smoking use included cigarettes. She has never used smokeless tobacco. She reports that she does not currently use alcohol. She reports that she does not currently use drugs.     Family History:   Family History  Adopted: Yes  Problem Relation Age of Onset   Hypertension Maternal Uncle    Hyperlipidemia  Maternal Uncle    ______________________________________________________________________________________________ Allergies: No Known Allergies   Prior to Admission medications   Medication Sig Start Date End Date Taking? Authorizing Provider  LUCEMYRA 0.18 MG TABS Take 1 tablet by mouth 4 (four) times daily as needed. 06/22/21   [provider]  naloxone [facility]) nasal spray 4 mg/0.1 mL SMARTSIG:1 Spray(s) Both Nares As Directed PRN 06/15/21   [provider]    ___________________________________________________________________________________________________ Physical Exam:    06/28/2023   12:38 AM 06/27/2023    9:30 PM 06/27/2023    7:42 PM  Vitals with BMI  Height   5\' 4"   Weight   125 lbs  BMI   21.45  Systolic 114 105  Diastolic 51 66   Pulse 72 83     1. General:  in No  Acute distress    Chronically ill  -appearing 2. Psychological: Alert and Oriented 3. Head/ENT:   Dry Mucous Membranes                          Head Non traumatic, neck supple                          Normal Dentition 4. SKIN:  decreased Skin turgor,  Skin clean Dry and intact      5. Heart: Regular rate and rhythm no  Murmur, no Rub or gallop 6. Lungs:   no wheezes or crackles   7. Abdomen: Soft,  non-tender, Non distended bowel sounds present 8. Lower extremities: no clubbing, cyanosis, no  edema 9. Neurologically Grossly intact, moving all 4 extremities equally  10. MSK: Normal range of motion    Chart has been reviewed  ______________________________________________________________________________________________  Assessment/Plan  33 y.o. female with medical history significant of ADHD and drug abuse  Admitted for  Cellulitis of left lower extremity    Present on Admission:  Cellulitis  IV drug user     Cellulitis -admit per  cellulitis protocol will   continue current antibiotic choice cefepime and vancomycin      plain films showed:   no evidence of air  no evidence  of osteomyelitis   no               foreign   objects   Will obtain MRSA screening,       obtain blood cultures  if febrile or septic     further antibiotic adjustment pending above results   IV drug user Transitional care consult Spoke about importance of cessation Blood cultures pending Check HIV and hepatitis status and RPR  Buprenorphine protocol  ordered   Other plan as per orders.  DVT prophylaxis:  Lovenox   enoxaparin (LOVENOX) injection 40 mg Start: 06/28/23 1000   Code Status:    Code Status: Full Code FULL CODE as per patient  I had personally discussed CODE STATUS with patient  ACP  none   Family Communication:   Family not at  Bedside  Diet  Diet Orders (From admission, onward)     Start     Ordered   06/28/23 0041  Diet regular Room service appropriate? Yes; Fluid consistency: Thin  Diet effective now       Question Answer Comment  Room service appropriate? Yes   Fluid consistency: Thin      06/28/23 0041            Disposition Plan:   To home once workup is complete and patient is stable   Following barriers for discharge:                                                        Pain controlled with PO medications                             transition to PO antibiotics  Consult Orders  (From admission, onward)           Start     Ordered   06/27/23 2212  Consult to hospitalist  Once       Provider:  (Not yet assigned)  Question Answer Comment  Place call to: [facility]   Reason for Consult Admit      06/27/23 2211   06/27/23 2057  Consult to hospitalist  -Called [facility] at 857pm, spoke to [facility].  Once       Provider:  (Not yet assigned)  Question Answer Comment  Place call to: [facility]   Reason for Consult Admit      06/27/23 2056                             Transition of care consulted                                Consults called: none     Admission status:  ED Disposition      ED Disposition  Admit   Condition  --   Comment  Hospital Area: [facility] [[id number]]  Level of Care: Telemetry [5]  Admit to tele based on following criteria: Other see comments  Comments: Close monitoring  May admit patient to [facility] or [facility] if equivalent level of care is available:: Yes  Interfacility transfer: Yes  Covid Evaluation: Asymptomatic - no recent exposure (last 10 days) testing not required  Diagnosis: Cellulitis [161096]  Admitting Physician: Marcellus Sers [[id number]]  Attending Physician: SCHLOSSMAN, ERIN [[id number]]  Certification:: I certify this patient will need inpatient services for at least 2 midnights  Expected Medical Readiness: 06/29/2023            inpatient     I Expect 2 midnight stay secondary to severity of patient's current illness need for inpatient interventions justified by the following:     Severe lab/radiological/exam abnormalities including:    Cellulitis of left lower extremity  and extensive comorbidities including:  substance abuse    That are currently affecting medical management.   I expect  patient to be hospitalized for 2 midnights requiring inpatient medical care.  Patient is at high risk for adverse outcome (such as loss of life or disability) if not treated.  Indication for inpatient stay as follows:    Need for IV antibiotics, IV fluids,     Level of care  24H     medical floor         Rashid Whitenight 06/28/2023, 1:20 AM    [facility]     after 2 AM please page floor coverage   If 7AM-7PM, please contact the day team taking care of the patient using [email or web address]

## 2023-06-29 DIAGNOSIS — S81802A Unspecified open wound, left lower leg, initial encounter: Secondary | ICD-10-CM

## 2023-06-29 DIAGNOSIS — F1990 Other psychoactive substance use, unspecified, uncomplicated: Secondary | ICD-10-CM | POA: Diagnosis not present

## 2023-06-29 DIAGNOSIS — L03116 Cellulitis of left lower limb: Secondary | ICD-10-CM | POA: Diagnosis not present

## 2023-06-29 NOTE — Progress Notes (Signed)
 A staff member notified this RN of the patient sleeping with a visible vape in her bed. This RN went woke up the patient and explained to her that the vape is contraband and must be confiscated. Patient was agreeable to RN removing vape. This RN questioned the patient about any other contraband, patient denied any other contraband in her presence. Vape is in patient's chart and charge RN notified of the encounter.

## 2023-06-29 NOTE — Plan of Care (Signed)

## 2023-06-29 NOTE — Consult Note (Signed)
 [facility]  Reason for Consult: Left leg wound Referring Physician: Dr. Aneita Keens  Gabrielle Black is an 33 y.o. female.  HPI: Patient is a 34 year old female with past medical history of polysubstance abuse including IV drug use who was admitted to the hospital on 06/28/2023 for left leg wounds and cellulitis refractory to oral antibiotics.  Started on vancomycin  and cefepime  on admission.  Orthopedics consulted.  MRI obtained demonstrated mild diffuse subcutaneous fat edema and swelling, possibly representative of cellulitis.  No walled off rim-enhancing fluid collections concerning for abscess.  WOC RN suspects possible TRANQ exposure as cause for to full-thickness wounds with overlying eschar.  Recommendations were made for Medihoney dressing changes.  She reports noticing the wound mid April -specifically April 17.  Patient reports that she is withdrawing from fentanyl.  She reports that she has chills and sweats.  She reports otherwise she is feeling fine, she denies any cardiac or pulmonary disease.  She denies any history of complications with anesthesia, does report that in the past with anesthesia she has required "extra" and reports waking up "belligerent".  She does not report any other concerns.  She is agreeable to plan for surgery.  She does state that she was unaware that she would potentially be here by Monday.  She reports that she has a child at home that she needs to get home to.  Past Medical History:  Diagnosis Date   ADHD    Current mild episode of major depressive disorder without prior episode (HCC) 09/11/2019   HSV-2 (herpes simplex virus 2) infection    IV drug user     History reviewed. No pertinent surgical history.  Family History  Adopted: Yes  Problem Relation Age of Onset   Hypertension Maternal Uncle    Hyperlipidemia Maternal Uncle     Social History:  reports that she has quit smoking. Her smoking use included cigarettes. She has  never used smokeless tobacco. She reports that she does not currently use alcohol. She reports that she does not currently use drugs.  Allergies: No Known Allergies  Medications: I have reviewed the patient's current medications.  Results for orders placed or performed during the hospital encounter of 06/27/23 (from the past 48 hours)  Culture, blood (Routine x 2)     Status: None (Preliminary result)   Collection Time: 06/27/23  8:00 PM   Specimen: BLOOD LEFT FOREARM  Result Value Ref Range   Specimen Description      BLOOD LEFT FOREARM Performed at [facility], [address], [place], [place] [address]    Special Requests      BOTTLES DRAWN AEROBIC AND ANAEROBIC Blood Culture results may not be optimal due to an inadequate volume of blood received in culture bottles Performed at [facility], [address], [place], [place] [address]    Culture      NO GROWTH 1 DAY Performed at [facility], [address] [place], [place] [address]    Report Status PENDING   Comprehensive metabolic panel     Status: None   Collection Time: 06/27/23  8:12 PM  Result Value Ref Range   Sodium 139 135 - 145 mmol/L   Potassium 4.0 3.5 - 5.1 mmol/L   Chloride 101 98 - 111 mmol/L   CO2 25 22 - 32 mmol/L   Glucose, Bld 77 70 - 99 mg/dL    Comment: Glucose reference range applies only to samples taken after fasting  for at least 8 hours.   BUN 10 6 - 20 mg/dL   Creatinine, Ser 0.86 0.44 - 1.00 mg/dL   Calcium 57.8 8.9 - 46.9 mg/dL   Total Protein 7.4 6.5 - 8.1 g/dL   Albumin 4.8 3.5 - 5.0 g/dL   AST 23 15 - 41 U/L   ALT 13 0 - 44 U/L   Alkaline Phosphatase 49 38 - 126 U/L   Total Bilirubin 0.4 0.0 - 1.2 mg/dL   GFR, Estimated >62 >95 mL/min    Comment: (NOTE) Calculated using the CKD-EPI Creatinine Equation (2021)    Anion gap 13 5 - 15    Comment: Performed at [facility], [address], [place], [place] [address]  Lactic acid, plasma      Status: None   Collection Time: 06/27/23  8:12 PM  Result Value Ref Range   Lactic Acid, Venous 1.9 0.5 - 1.9 mmol/L    Comment: Performed at [facility], [address] [place], [place] [address]  CBC with Differential     Status: None   Collection Time: 06/27/23  8:12 PM  Result Value Ref Range   WBC 6.6 4.0 - 10.5 K/uL   RBC 3.98 3.87 - 5.11 MIL/uL   Hemoglobin 12.0 12.0 - 15.0 g/dL   HCT 02.7 25.3 - 66.4 %   MCV 91.5 80.0 - 100.0 fL   MCH 30.2 26.0 - 34.0 pg   MCHC 33.0 30.0 - 36.0 g/dL   RDW 40.3 47.4 - 25.9 %   Platelets 230 150 - 400 K/uL   nRBC 0.0 0.0 - 0.2 %   Neutrophils Relative % 60 %   Neutro Abs 3.9 1.7 - 7.7 K/uL   Lymphocytes Relative 30 %   Lymphs Abs 2.0 0.7 - 4.0 K/uL   Monocytes Relative 7 %   Monocytes Absolute 0.5 0.1 - 1.0 K/uL   Eosinophils Relative 2 %   Eosinophils Absolute 0.2 0.0 - 0.5 K/uL   Basophils Relative 1 %   Basophils Absolute 0.0 0.0 - 0.1 K/uL   Immature Granulocytes 0 %   Abs Immature Granulocytes 0.01 0.00 - 0.07 K/uL    Comment: Performed at [facility], [address] [place], [place] [address]  Protime-INR     Status: None   Collection Time: 06/27/23  8:12 PM  Result Value Ref Range   Prothrombin Time 13.4 11.4 - 15.2 seconds   INR 1.0 0.8 - 1.2    Comment: (NOTE) INR goal varies based on device and disease states. Performed at [facility], [address], [place], [place] [address]   Urinalysis, w/ Reflex to Culture (Infection Suspected) -Urine, Clean Catch     Status: Abnormal   Collection Time: 06/27/23 10:24 PM  Result Value Ref Range   Specimen Source URINE, CATHETERIZED    Color, Urine YELLOW YELLOW   APPearance HAZY (A) CLEAR   Specific Gravity, Urine 1.017 1.005 - 1.030   pH 6.0 5.0 - 8.0   Glucose, UA NEGATIVE NEGATIVE mg/dL   Hgb urine dipstick TRACE (A) NEGATIVE   Bilirubin Urine NEGATIVE NEGATIVE   Ketones, ur NEGATIVE NEGATIVE mg/dL    Protein, ur NEGATIVE NEGATIVE mg/dL   Nitrite NEGATIVE NEGATIVE   Leukocytes,Ua NEGATIVE NEGATIVE   RBC / HPF 0-5 0 - 5 RBC/hpf   WBC, UA 0-5 0 - 5 WBC/hpf    Comment:        Reflex urine culture not performed if WBC <=10, OR if  Squamous epithelial cells >5. If Squamous epithelial cells >5 suggest recollection.    Bacteria, UA RARE (A) NONE SEEN   Squamous Epithelial / HPF 0-5 0 - 5 /HPF   Mucus PRESENT     Comment: Performed at [facility], [address], [place], [place] [address]  Pregnancy, urine     Status: None   Collection Time: 06/27/23 10:24 PM  Result Value Ref Range   Preg Test, Ur NEGATIVE NEGATIVE    Comment:        THE SENSITIVITY OF THIS METHODOLOGY IS >25 mIU/mL. Performed at [facility], [address], [place], [place] [address]   MRSA Next Gen by PCR, Nasal     Status: None   Collection Time: 06/28/23  1:55 AM   Specimen: Nasal Mucosa; Nasal Swab  Result Value Ref Range   MRSA by PCR Next Gen NOT DETECTED NOT DETECTED    Comment: (NOTE) The GeneXpert MRSA Assay (FDA approved for NASAL specimens only), is one component of a comprehensive MRSA colonization surveillance program. It is not intended to diagnose MRSA infection nor to guide or monitor treatment for MRSA infections. Test performance is not FDA approved in patients less than 26 years old. Performed at [facility], [address], [place], [place] [address]   Rapid urine drug screen (hospital performed)     Status: Abnormal   Collection Time: 06/28/23  2:01 AM  Result Value Ref Range   Opiates NONE DETECTED NONE DETECTED   Cocaine POSITIVE (A) NONE DETECTED   Benzodiazepines NONE DETECTED NONE DETECTED   Amphetamines NONE DETECTED NONE DETECTED   Tetrahydrocannabinol NONE DETECTED NONE DETECTED   Barbiturates NONE DETECTED NONE DETECTED    Comment: (NOTE) DRUG SCREEN FOR MEDICAL PURPOSES ONLY.  IF CONFIRMATION IS NEEDED FOR ANY  PURPOSE, NOTIFY LAB WITHIN 5 DAYS.  LOWEST DETECTABLE LIMITS FOR URINE DRUG SCREEN Drug Class                     Cutoff (ng/mL) Amphetamine and metabolites    1000 Barbiturate and metabolites    200 Benzodiazepine                 200 Opiates and metabolites        300 Cocaine and metabolites        300 THC                            50 Performed at [facility], [address], [place], [place] [address]   Lactic acid, plasma     Status: None   Collection Time: 06/28/23  6:17 AM  Result Value Ref Range   Lactic Acid, Venous 1.2 0.5 - 1.9 mmol/L    Comment: Performed at [facility], [address] [place], [place] [address]  HIV Antibody (routine testing w rflx)     Status: None   Collection Time: 06/28/23  6:17 AM  Result Value Ref Range   HIV Screen 4th Generation wRfx Non Reactive Non Reactive    Comment: Performed at [facility], [address] [place], [place] [address]  Magnesium     Status: None   Collection Time: 06/28/23  6:17 AM  Result Value Ref Range   Magnesium 2.1 1.7 - 2.4 mg/dL    Comment: Performed at [facility], [address] [place], [place] [address]  Phosphorus  Status: None   Collection Time: 06/28/23  6:17 AM  Result Value Ref Range   Phosphorus 3.8 2.5 - 4.6 mg/dL    Comment: Performed at [facility], [address] [place], [place] [address]  Comprehensive metabolic panel     Status: Abnormal   Collection Time: 06/28/23  6:17 AM  Result Value Ref Range   Sodium 139 135 - 145 mmol/L   Potassium 3.3 (L) 3.5 - 5.1 mmol/L   Chloride 107 98 - 111 mmol/L   CO2 23 22 - 32 mmol/L   Glucose, Bld 108 (H) 70 - 99 mg/dL    Comment: Glucose reference range applies only to samples taken after fasting for at least 8 hours.   BUN 9 6 - 20 mg/dL   Creatinine, Ser 1.91 0.44 - 1.00 mg/dL   Calcium 8.8 (L) 8.9 - 10.3 mg/dL   Total Protein 6.6 6.5 - 8.1 g/dL   Albumin 3.7  3.5 - 5.0 g/dL   AST 18 15 - 41 U/L   ALT 14 0 - 44 U/L   Alkaline Phosphatase 35 (L) 38 - 126 U/L   Total Bilirubin 0.4 0.0 - 1.2 mg/dL   GFR, Estimated >47 >82 mL/min    Comment: (NOTE) Calculated using the CKD-EPI Creatinine Equation (2021)    Anion gap 9 5 - 15    Comment: Performed at [facility], [address] [place], [place] [address]  CBC     Status: Abnormal   Collection Time: 06/28/23  6:17 AM  Result Value Ref Range   WBC 5.9 4.0 - 10.5 K/uL   RBC 3.79 (L) 3.87 - 5.11 MIL/uL   Hemoglobin 11.5 (L) 12.0 - 15.0 g/dL   HCT 30.8 65.7 - 84.6 %   MCV 95.0 80.0 - 100.0 fL   MCH 30.3 26.0 - 34.0 pg   MCHC 31.9 30.0 - 36.0 g/dL   RDW 96.2 95.2 - 84.1 %   Platelets 250 150 - 400 K/uL   nRBC 0.0 0.0 - 0.2 %    Comment: Performed at [facility], [address] [place], [place] [address]  Hepatitis panel, acute     Status: None   Collection Time: 06/28/23  6:17 AM  Result Value Ref Range   Hepatitis B Surface Ag NON REACTIVE NON REACTIVE   HCV Ab NON REACTIVE NON REACTIVE    Comment: (NOTE) Nonreactive HCV antibody screen is consistent with no HCV infections,  unless recent infection is suspected or other evidence exists to indicate HCV infection.     Hep A IgM NON REACTIVE NON REACTIVE   Hep B C IgM NON REACTIVE NON REACTIVE    Comment: Performed at [facility], [address] [place], [place] [address]  RPR     Status: None   Collection Time: 06/28/23  6:17 AM  Result Value Ref Range   RPR Ser Ql NON REACTIVE NON REACTIVE    Comment: Performed at [facility], [address], [place], [place] [address]  Culture, blood (Routine x 2)     Status: None (Preliminary result)   Collection Time: 06/28/23  6:19 AM   Specimen: BLOOD RIGHT FOREARM  Result Value Ref Range   Specimen Description      BLOOD RIGHT FOREARM Performed at [facility], [address] [place], [place] [address]    Special Requests       BOTTLES DRAWN AEROBIC AND ANAEROBIC Blood Culture  results may not be optimal due to an inadequate volume of blood received in culture bottles Performed at [facility], [address] [place], [place] [address]    Culture      NO GROWTH < 24 HOURS Performed at [facility], [address], [place], [place] [address]    Report Status PENDING     MR TIBIA FIBULA LEFT W WO CONTRAST Result Date: 06/28/2023 CLINICAL DATA:  Left leg wound infection. Evaluate for abscess. Patient admits to recreational drug use. Appearance of wound is consistent with possible "TRANQ" exposure; this is in added tip in some street drugs which causes wounds with tissue necrosis. EXAM: MRI OF LOWER LEFT EXTREMITY WITHOUT AND WITH CONTRAST TECHNIQUE: Multiplanar, multisequence MR imaging of the left tibia and fibula was performed both before and after administration of intravenous contrast. CONTRAST:  6mL GADAVIST  GADOBUTROL  1 MMOL/ML IV SOLN COMPARISON:  left tibia and fibula radiographs 06/27/2023 FINDINGS: Bones/Joint/Cartilage Normal marrow signal throughout the tibia. The cortices are intact. There is very mild increased T2 signal and enhancement within a short segment of the fibula at the junction of the proximal third and middle third of the fibular diaphysis (axial series 11, image 24 and axial series 18, image 24). No periosteal edema and the cortex is intact. This is likely incidental. Ligaments The knee medial collateral ligament and fibular collateral ligament appear grossly intact, although the proximal aspect is incompletely visualized. Muscles and Tendons Normal size of the regional calf musculature. Mild edema without enhancement of the peripheral distal aspect of the lateral head of the gastrocnemius greater than medial head of the gastrocnemius muscles which. No intramuscular fluid collection is seen. No tendon tear. Soft tissues A marker overlies the anterolateral proximal calf at the level  of the proximal tibial diaphysis and a second marker overlies a similar anterolateral aspect of the proximal to mid calf at the level of the mid tibial diaphysis. In between these markers, there is a wound/ulceration of the superficial subcutaneous fat measuring up to approximately 3 mm in depth (axial images 27 through 30), 1.6 cm in transverse dimension, and 2.2 cm in craniocaudal length (sagittal image 25 and coronal images 19 and 20). Mild diffuse left calf subcutaneous fat edema and swelling, greatest within the distal lateral calf where there is mild coalescing fluid at the deep aspect of the subcutaneous fat (axial series 11 images 46 through 64). This subcutaneous fat edema is also greater at the distal anterior calf subcutaneous fat (axial image 55). No walled-off, rim enhancing fluid collection is seen to indicate an abscess. Small partially visualized left knee joint effusion. IMPRESSION: 1. Wound/ulceration of the superficial subcutaneous fat of the anterolateral proximal to mid calf at the level of the proximal tibial diaphysis. 2. Mild diffuse left calf subcutaneous fat edema and swelling, greatest within the distal lateral calf where there is mild coalescing fluid at the deep aspect of the subcutaneous fat. This is nonspecific and may represent cellulitis. No walled-off, rim enhancing fluid collection is seen to indicate an abscess. 3. No evidence of osteomyelitis. 4. Mild edema without enhancement of the peripheral distal aspect of the lateral head of the gastrocnemius greater than medial head of the gastrocnemius muscles which may represent mild muscle strain Electronically Signed   By: Bertina Broccoli M.D.   On: 06/28/2023 16:03   DG Tibia/Fibula Left Result Date: 06/27/2023 CLINICAL DATA:  Pain EXAM: LEFT TIBIA AND FIBULA - 2 VIEW COMPARISON:  None FINDINGS: There is  no evidence of fracture or other focal bone lesions. There is diffuse soft tissue swelling of the lower extremity. No dislocation.  IMPRESSION: Diffuse soft tissue swelling of the lower extremity. No acute fracture or dislocation. Electronically Signed   By: Tyron Gallon M.D.   On: 06/27/2023 20:24    Review of Systems  Constitutional:  Positive for chills and malaise/fatigue.  Respiratory: Negative.    Cardiovascular: Negative.   Gastrointestinal: Negative.   Genitourinary: Negative.    Blood pressure (!) 94/55, pulse 61, temperature (!) 97.4 F (36.3 C), temperature source Oral, resp. rate 14, height 5\' 4"  (1.626 m), weight 56.7 kg, SpO2 99%. Physical Exam Constitutional:      General: She is not in acute distress.    Appearance: She is ill-appearing and diaphoretic. She is not toxic-appearing.  HENT:     Head: Normocephalic and atraumatic.  Pulmonary:     Effort: Pulmonary effort is normal.  Neurological:     Mental Status: She is alert.    Left lower extremity: Left lower extremity wound with dressing in place, Mepilex border dressing.  There is no surrounding erythema.  There is no surrounding tenderness with palpation.  Distal extremities warm to touch, distal extremity with sensation intact.  I do not appreciate any crepitus with palpation of the periwound area.  Calf is soft and nontender.  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  Patient without leukocytosis.  Possible cellulitis per MRI, but no crepitus or concern for necrotizing infection on exam.  Plan for debridement of left lower extremity wound on Monday, 07/02/2023 with Dr. Carolynne Citron.  N.p.o. after midnight -orders placed.  Discussed risks of debridement of left lower extremity wound with patient.  She has tolerated anesthesia well in the past.  She denies any history of  DVTs.  Patient did report that she did not expect to be hospitalized over the weekend, if patient is discharged or leaves AMA, recommend following up with outpatient wound care center for ongoing management.  DVT prophylaxis per primary    Janalyn Me Travious Vanover, PA-C 06/29/2023, 12:00 PM

## 2023-06-29 NOTE — Progress Notes (Signed)
 No acute changes this shift, patient did not show any signs of withdrawal , refused the last two doses of Clonidine , she's not eating or drinking despite nursing encouragement, no s/s of distress, will continue to monitor

## 2023-06-29 NOTE — H&P (View-Only) (Signed)
 [facility]  Reason for Consult: Left leg wound Referring Physician: Dr. Aneita Keens  Gabrielle Black is an 33 y.o. female.  HPI: Patient is a 34 year old female with past medical history of polysubstance abuse including IV drug use who was admitted to the hospital on 06/28/2023 for left leg wounds and cellulitis refractory to oral antibiotics.  Started on vancomycin  and cefepime  on admission.  Orthopedics consulted.  MRI obtained demonstrated mild diffuse subcutaneous fat edema and swelling, possibly representative of cellulitis.  No walled off rim-enhancing fluid collections concerning for abscess.  WOC RN suspects possible TRANQ exposure as cause for to full-thickness wounds with overlying eschar.  Recommendations were made for Medihoney dressing changes.  She reports noticing the wound mid April -specifically April 17.  Patient reports that she is withdrawing from fentanyl.  She reports that she has chills and sweats.  She reports otherwise she is feeling fine, she denies any cardiac or pulmonary disease.  She denies any history of complications with anesthesia, does report that in the past with anesthesia she has required "extra" and reports waking up "belligerent".  She does not report any other concerns.  She is agreeable to plan for surgery.  She does state that she was unaware that she would potentially be here by Monday.  She reports that she has a child at home that she needs to get home to.  Past Medical History:  Diagnosis Date   ADHD    Current mild episode of major depressive disorder without prior episode (HCC) 09/11/2019   HSV-2 (herpes simplex virus 2) infection    IV drug user     History reviewed. No pertinent surgical history.  Family History  Adopted: Yes  Problem Relation Age of Onset   Hypertension Maternal Uncle    Hyperlipidemia Maternal Uncle     Social History:  reports that she has quit smoking. Her smoking use included cigarettes. She has  never used smokeless tobacco. She reports that she does not currently use alcohol. She reports that she does not currently use drugs.  Allergies: No Known Allergies  Medications: I have reviewed the patient's current medications.  Results for orders placed or performed during the hospital encounter of 06/27/23 (from the past 48 hours)  Culture, blood (Routine x 2)     Status: None (Preliminary result)   Collection Time: 06/27/23  8:00 PM   Specimen: BLOOD LEFT FOREARM  Result Value Ref Range   Specimen Description      BLOOD LEFT FOREARM Performed at [facility], [address], [place], [place] [address]    Special Requests      BOTTLES DRAWN AEROBIC AND ANAEROBIC Blood Culture results may not be optimal due to an inadequate volume of blood received in culture bottles Performed at [facility], [address], [place], [place] [address]    Culture      NO GROWTH 1 DAY Performed at [facility], [address] [place], [place] [address]    Report Status PENDING   Comprehensive metabolic panel     Status: None   Collection Time: 06/27/23  8:12 PM  Result Value Ref Range   Sodium 139 135 - 145 mmol/L   Potassium 4.0 3.5 - 5.1 mmol/L   Chloride 101 98 - 111 mmol/L   CO2 25 22 - 32 mmol/L   Glucose, Bld 77 70 - 99 mg/dL    Comment: Glucose reference range applies only to samples taken after fasting  for at least 8 hours.   BUN 10 6 - 20 mg/dL   Creatinine, Ser 0.86 0.44 - 1.00 mg/dL   Calcium 57.8 8.9 - 46.9 mg/dL   Total Protein 7.4 6.5 - 8.1 g/dL   Albumin 4.8 3.5 - 5.0 g/dL   AST 23 15 - 41 U/L   ALT 13 0 - 44 U/L   Alkaline Phosphatase 49 38 - 126 U/L   Total Bilirubin 0.4 0.0 - 1.2 mg/dL   GFR, Estimated >62 >95 mL/min    Comment: (NOTE) Calculated using the CKD-EPI Creatinine Equation (2021)    Anion gap 13 5 - 15    Comment: Performed at [facility], [address], [place], [place] [address]  Lactic acid, plasma      Status: None   Collection Time: 06/27/23  8:12 PM  Result Value Ref Range   Lactic Acid, Venous 1.9 0.5 - 1.9 mmol/L    Comment: Performed at [facility], [address] [place], [place] [address]  CBC with Differential     Status: None   Collection Time: 06/27/23  8:12 PM  Result Value Ref Range   WBC 6.6 4.0 - 10.5 K/uL   RBC 3.98 3.87 - 5.11 MIL/uL   Hemoglobin 12.0 12.0 - 15.0 g/dL   HCT 02.7 25.3 - 66.4 %   MCV 91.5 80.0 - 100.0 fL   MCH 30.2 26.0 - 34.0 pg   MCHC 33.0 30.0 - 36.0 g/dL   RDW 40.3 47.4 - 25.9 %   Platelets 230 150 - 400 K/uL   nRBC 0.0 0.0 - 0.2 %   Neutrophils Relative % 60 %   Neutro Abs 3.9 1.7 - 7.7 K/uL   Lymphocytes Relative 30 %   Lymphs Abs 2.0 0.7 - 4.0 K/uL   Monocytes Relative 7 %   Monocytes Absolute 0.5 0.1 - 1.0 K/uL   Eosinophils Relative 2 %   Eosinophils Absolute 0.2 0.0 - 0.5 K/uL   Basophils Relative 1 %   Basophils Absolute 0.0 0.0 - 0.1 K/uL   Immature Granulocytes 0 %   Abs Immature Granulocytes 0.01 0.00 - 0.07 K/uL    Comment: Performed at [facility], [address] [place], [place] [address]  Protime-INR     Status: None   Collection Time: 06/27/23  8:12 PM  Result Value Ref Range   Prothrombin Time 13.4 11.4 - 15.2 seconds   INR 1.0 0.8 - 1.2    Comment: (NOTE) INR goal varies based on device and disease states. Performed at [facility], [address], [place], [place] [address]   Urinalysis, w/ Reflex to Culture (Infection Suspected) -Urine, Clean Catch     Status: Abnormal   Collection Time: 06/27/23 10:24 PM  Result Value Ref Range   Specimen Source URINE, CATHETERIZED    Color, Urine YELLOW YELLOW   APPearance HAZY (A) CLEAR   Specific Gravity, Urine 1.017 1.005 - 1.030   pH 6.0 5.0 - 8.0   Glucose, UA NEGATIVE NEGATIVE mg/dL   Hgb urine dipstick TRACE (A) NEGATIVE   Bilirubin Urine NEGATIVE NEGATIVE   Ketones, ur NEGATIVE NEGATIVE mg/dL    Protein, ur NEGATIVE NEGATIVE mg/dL   Nitrite NEGATIVE NEGATIVE   Leukocytes,Ua NEGATIVE NEGATIVE   RBC / HPF 0-5 0 - 5 RBC/hpf   WBC, UA 0-5 0 - 5 WBC/hpf    Comment:        Reflex urine culture not performed if WBC <=10, OR if  Squamous epithelial cells >5. If Squamous epithelial cells >5 suggest recollection.    Bacteria, UA RARE (A) NONE SEEN   Squamous Epithelial / HPF 0-5 0 - 5 /HPF   Mucus PRESENT     Comment: Performed at [facility], [address], [place], [place] [address]  Pregnancy, urine     Status: None   Collection Time: 06/27/23 10:24 PM  Result Value Ref Range   Preg Test, Ur NEGATIVE NEGATIVE    Comment:        THE SENSITIVITY OF THIS METHODOLOGY IS >25 mIU/mL. Performed at [facility], [address], [place], [place] [address]   MRSA Next Gen by PCR, Nasal     Status: None   Collection Time: 06/28/23  1:55 AM   Specimen: Nasal Mucosa; Nasal Swab  Result Value Ref Range   MRSA by PCR Next Gen NOT DETECTED NOT DETECTED    Comment: (NOTE) The GeneXpert MRSA Assay (FDA approved for NASAL specimens only), is one component of a comprehensive MRSA colonization surveillance program. It is not intended to diagnose MRSA infection nor to guide or monitor treatment for MRSA infections. Test performance is not FDA approved in patients less than 26 years old. Performed at [facility], [address], [place], [place] [address]   Rapid urine drug screen (hospital performed)     Status: Abnormal   Collection Time: 06/28/23  2:01 AM  Result Value Ref Range   Opiates NONE DETECTED NONE DETECTED   Cocaine POSITIVE (A) NONE DETECTED   Benzodiazepines NONE DETECTED NONE DETECTED   Amphetamines NONE DETECTED NONE DETECTED   Tetrahydrocannabinol NONE DETECTED NONE DETECTED   Barbiturates NONE DETECTED NONE DETECTED    Comment: (NOTE) DRUG SCREEN FOR MEDICAL PURPOSES ONLY.  IF CONFIRMATION IS NEEDED FOR ANY  PURPOSE, NOTIFY LAB WITHIN 5 DAYS.  LOWEST DETECTABLE LIMITS FOR URINE DRUG SCREEN Drug Class                     Cutoff (ng/mL) Amphetamine and metabolites    1000 Barbiturate and metabolites    200 Benzodiazepine                 200 Opiates and metabolites        300 Cocaine and metabolites        300 THC                            50 Performed at [facility], [address], [place], [place] [address]   Lactic acid, plasma     Status: None   Collection Time: 06/28/23  6:17 AM  Result Value Ref Range   Lactic Acid, Venous 1.2 0.5 - 1.9 mmol/L    Comment: Performed at [facility], [address] [place], [place] [address]  HIV Antibody (routine testing w rflx)     Status: None   Collection Time: 06/28/23  6:17 AM  Result Value Ref Range   HIV Screen 4th Generation wRfx Non Reactive Non Reactive    Comment: Performed at [facility], [address] [place], [place] [address]  Magnesium     Status: None   Collection Time: 06/28/23  6:17 AM  Result Value Ref Range   Magnesium 2.1 1.7 - 2.4 mg/dL    Comment: Performed at [facility], [address] [place], [place] [address]  Phosphorus  Status: None   Collection Time: 06/28/23  6:17 AM  Result Value Ref Range   Phosphorus 3.8 2.5 - 4.6 mg/dL    Comment: Performed at [facility], [address] [place], [place] [address]  Comprehensive metabolic panel     Status: Abnormal   Collection Time: 06/28/23  6:17 AM  Result Value Ref Range   Sodium 139 135 - 145 mmol/L   Potassium 3.3 (L) 3.5 - 5.1 mmol/L   Chloride 107 98 - 111 mmol/L   CO2 23 22 - 32 mmol/L   Glucose, Bld 108 (H) 70 - 99 mg/dL    Comment: Glucose reference range applies only to samples taken after fasting for at least 8 hours.   BUN 9 6 - 20 mg/dL   Creatinine, Ser 1.91 0.44 - 1.00 mg/dL   Calcium 8.8 (L) 8.9 - 10.3 mg/dL   Total Protein 6.6 6.5 - 8.1 g/dL   Albumin 3.7  3.5 - 5.0 g/dL   AST 18 15 - 41 U/L   ALT 14 0 - 44 U/L   Alkaline Phosphatase 35 (L) 38 - 126 U/L   Total Bilirubin 0.4 0.0 - 1.2 mg/dL   GFR, Estimated >47 >82 mL/min    Comment: (NOTE) Calculated using the CKD-EPI Creatinine Equation (2021)    Anion gap 9 5 - 15    Comment: Performed at [facility], [address] [place], [place] [address]  CBC     Status: Abnormal   Collection Time: 06/28/23  6:17 AM  Result Value Ref Range   WBC 5.9 4.0 - 10.5 K/uL   RBC 3.79 (L) 3.87 - 5.11 MIL/uL   Hemoglobin 11.5 (L) 12.0 - 15.0 g/dL   HCT 30.8 65.7 - 84.6 %   MCV 95.0 80.0 - 100.0 fL   MCH 30.3 26.0 - 34.0 pg   MCHC 31.9 30.0 - 36.0 g/dL   RDW 96.2 95.2 - 84.1 %   Platelets 250 150 - 400 K/uL   nRBC 0.0 0.0 - 0.2 %    Comment: Performed at [facility], [address] [place], [place] [address]  Hepatitis panel, acute     Status: None   Collection Time: 06/28/23  6:17 AM  Result Value Ref Range   Hepatitis B Surface Ag NON REACTIVE NON REACTIVE   HCV Ab NON REACTIVE NON REACTIVE    Comment: (NOTE) Nonreactive HCV antibody screen is consistent with no HCV infections,  unless recent infection is suspected or other evidence exists to indicate HCV infection.     Hep A IgM NON REACTIVE NON REACTIVE   Hep B C IgM NON REACTIVE NON REACTIVE    Comment: Performed at [facility], [address] [place], [place] [address]  RPR     Status: None   Collection Time: 06/28/23  6:17 AM  Result Value Ref Range   RPR Ser Ql NON REACTIVE NON REACTIVE    Comment: Performed at [facility], [address], [place], [place] [address]  Culture, blood (Routine x 2)     Status: None (Preliminary result)   Collection Time: 06/28/23  6:19 AM   Specimen: BLOOD RIGHT FOREARM  Result Value Ref Range   Specimen Description      BLOOD RIGHT FOREARM Performed at [facility], [address] [place], [place] [address]    Special Requests       BOTTLES DRAWN AEROBIC AND ANAEROBIC Blood Culture  results may not be optimal due to an inadequate volume of blood received in culture bottles Performed at [facility], [address] [place], [place] [address]    Culture      NO GROWTH < 24 HOURS Performed at [facility], [address], [place], [place] [address]    Report Status PENDING     MR TIBIA FIBULA LEFT W WO CONTRAST Result Date: 06/28/2023 CLINICAL DATA:  Left leg wound infection. Evaluate for abscess. Patient admits to recreational drug use. Appearance of wound is consistent with possible "TRANQ" exposure; this is in added tip in some street drugs which causes wounds with tissue necrosis. EXAM: MRI OF LOWER LEFT EXTREMITY WITHOUT AND WITH CONTRAST TECHNIQUE: Multiplanar, multisequence MR imaging of the left tibia and fibula was performed both before and after administration of intravenous contrast. CONTRAST:  6mL GADAVIST  GADOBUTROL  1 MMOL/ML IV SOLN COMPARISON:  left tibia and fibula radiographs 06/27/2023 FINDINGS: Bones/Joint/Cartilage Normal marrow signal throughout the tibia. The cortices are intact. There is very mild increased T2 signal and enhancement within a short segment of the fibula at the junction of the proximal third and middle third of the fibular diaphysis (axial series 11, image 24 and axial series 18, image 24). No periosteal edema and the cortex is intact. This is likely incidental. Ligaments The knee medial collateral ligament and fibular collateral ligament appear grossly intact, although the proximal aspect is incompletely visualized. Muscles and Tendons Normal size of the regional calf musculature. Mild edema without enhancement of the peripheral distal aspect of the lateral head of the gastrocnemius greater than medial head of the gastrocnemius muscles which. No intramuscular fluid collection is seen. No tendon tear. Soft tissues A marker overlies the anterolateral proximal calf at the level  of the proximal tibial diaphysis and a second marker overlies a similar anterolateral aspect of the proximal to mid calf at the level of the mid tibial diaphysis. In between these markers, there is a wound/ulceration of the superficial subcutaneous fat measuring up to approximately 3 mm in depth (axial images 27 through 30), 1.6 cm in transverse dimension, and 2.2 cm in craniocaudal length (sagittal image 25 and coronal images 19 and 20). Mild diffuse left calf subcutaneous fat edema and swelling, greatest within the distal lateral calf where there is mild coalescing fluid at the deep aspect of the subcutaneous fat (axial series 11 images 46 through 64). This subcutaneous fat edema is also greater at the distal anterior calf subcutaneous fat (axial image 55). No walled-off, rim enhancing fluid collection is seen to indicate an abscess. Small partially visualized left knee joint effusion. IMPRESSION: 1. Wound/ulceration of the superficial subcutaneous fat of the anterolateral proximal to mid calf at the level of the proximal tibial diaphysis. 2. Mild diffuse left calf subcutaneous fat edema and swelling, greatest within the distal lateral calf where there is mild coalescing fluid at the deep aspect of the subcutaneous fat. This is nonspecific and may represent cellulitis. No walled-off, rim enhancing fluid collection is seen to indicate an abscess. 3. No evidence of osteomyelitis. 4. Mild edema without enhancement of the peripheral distal aspect of the lateral head of the gastrocnemius greater than medial head of the gastrocnemius muscles which may represent mild muscle strain Electronically Signed   By: Bertina Broccoli M.D.   On: 06/28/2023 16:03   DG Tibia/Fibula Left Result Date: 06/27/2023 CLINICAL DATA:  Pain EXAM: LEFT TIBIA AND FIBULA - 2 VIEW COMPARISON:  None FINDINGS: There is  no evidence of fracture or other focal bone lesions. There is diffuse soft tissue swelling of the lower extremity. No dislocation.  IMPRESSION: Diffuse soft tissue swelling of the lower extremity. No acute fracture or dislocation. Electronically Signed   By: Tyron Gallon M.D.   On: 06/27/2023 20:24    Review of Systems  Constitutional:  Positive for chills and malaise/fatigue.  Respiratory: Negative.    Cardiovascular: Negative.   Gastrointestinal: Negative.   Genitourinary: Negative.    Blood pressure (!) 94/55, pulse 61, temperature (!) 97.4 F (36.3 C), temperature source Oral, resp. rate 14, height 5\' 4"  (1.626 m), weight 56.7 kg, SpO2 99%. Physical Exam Constitutional:      General: She is not in acute distress.    Appearance: She is ill-appearing and diaphoretic. She is not toxic-appearing.  HENT:     Head: Normocephalic and atraumatic.  Pulmonary:     Effort: Pulmonary effort is normal.  Neurological:     Mental Status: She is alert.    Left lower extremity: Left lower extremity wound with dressing in place, Mepilex border dressing.  There is no surrounding erythema.  There is no surrounding tenderness with palpation.  Distal extremities warm to touch, distal extremity with sensation intact.  I do not appreciate any crepitus with palpation of the periwound area.  Calf is soft and nontender.  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  Patient without leukocytosis.  Possible cellulitis per MRI, but no crepitus or concern for necrotizing infection on exam.  Plan for debridement of left lower extremity wound on Monday, 07/02/2023 with Dr. Carolynne Citron.  N.p.o. after midnight -orders placed.  Discussed risks of debridement of left lower extremity wound with patient.  She has tolerated anesthesia well in the past.  She denies any history of  DVTs.  Patient did report that she did not expect to be hospitalized over the weekend, if patient is discharged or leaves AMA, recommend following up with outpatient wound care center for ongoing management.  DVT prophylaxis per primary    Janalyn Me Travious Vanover, PA-C 06/29/2023, 12:00 PM

## 2023-06-29 NOTE — Progress Notes (Signed)
 PROGRESS NOTE    Gabrielle Black  ZOX:[id number] DOB: 01/06/1991 DOA: 06/27/2023 PCP: Patient, No Pcp Per   Brief Narrative: Gabrielle Black is a 33 y.o. female with a history of ADHD and drug abuse.  Patient presented secondary to left lower leg wound check with evidence of cellulitis and central necrosis of wound. IV antibiotics started. Orthopedic surgery consulted.   Assessment and Plan:  Necrotic leg wound Cellulitis Wound secondary to IV injection.  Patient started empirically on vancomycin  and cefepime  on admission. Orthopedic surgery consulted, Dr. Cherl Corner, who recommended MRI and wound care consult. MRI without evidence of abscess; ortho recommending plastic surgery consultation. Plastic surgery consulted with plan for debridement on 5/12 -Continue Vancomycin  and Cefepime  -Follow-up blood culture data  IV drug use Patient admits to using Fentanyl, amphetamine, cocaine use.  UDS positive for cocaine.  Concern for withdrawal symptoms precipitating while admitted. Patient with plans to attend outpatient rehabilitation. Suboxone  started for withdrawal symptoms. -Suboxone  once withdrawal symptoms start -Clonidine , Robaxin , Atarax , Naproxen , Bentyl , Imodium    DVT prophylaxis: Lovenox  Code Status:   Code Status: Full Code Family Communication: None at bedside Disposition Plan: Discharge pending ongoing specialist recommendations and outpatient antibiotics   Consultants:  Orthopedic surgery  Procedures:  None  Antimicrobials: Vancomycin  Cefepime     Subjective: Some withdrawal symptoms. Not feeling well.  Objective: BP (!) 105/56 (BP Location: Right Arm)   Pulse (!) 55   Temp (!) 97.5 F (36.4 C) (Axillary)   Resp 16   Ht 5\' 4"  (1.626 m)   Wt 56.7 kg   SpO2 98%   BMI 21.46 kg/m   Examination:  General exam: Appears calm and comfortable. Respiratory system: Respiratory effort normal. Central nervous system: Alert and oriented.  Psychiatry: Judgement and insight  appear normal. Mood & affect appropriate.    Data Reviewed: I have personally reviewed following labs and imaging studies  CBC Lab Results  Component Value Date   WBC 5.9 06/28/2023   RBC 3.79 (L) 06/28/2023   HGB 11.5 (L) 06/28/2023   HCT 36.0 06/28/2023   MCV 95.0 06/28/2023   MCH 30.3 06/28/2023   PLT 250 06/28/2023   MCHC 31.9 06/28/2023   RDW 13.3 06/28/2023   LYMPHSABS 2.0 06/27/2023   MONOABS 0.5 06/27/2023   EOSABS 0.2 06/27/2023   BASOSABS 0.0 06/27/2023     Last metabolic panel Lab Results  Component Value Date   NA 139 06/28/2023   K 3.3 (L) 06/28/2023   CL 107 06/28/2023   CO2 23 06/28/2023   BUN 9 06/28/2023   CREATININE 0.80 06/28/2023   GLUCOSE 108 (H) 06/28/2023   GFRNONAA >60 06/28/2023   GFRAA 144 10/23/2019   CALCIUM 8.8 (L) 06/28/2023   PHOS 3.8 06/28/2023   PROT 6.6 06/28/2023   ALBUMIN 3.7 06/28/2023   LABGLOB 2.1 07/19/2021   AGRATIO 2.0 07/19/2021   BILITOT 0.4 06/28/2023   ALKPHOS 35 (L) 06/28/2023   AST 18 06/28/2023   ALT 14 06/28/2023   ANIONGAP 9 06/28/2023    GFR: Estimated Creatinine Clearance: 88 mL/min (by C-G formula based on SCr of 0.8 mg/dL).  Recent Results (from the past 240 hours)  Culture, blood (Routine x 2)     Status: None (Preliminary result)   Collection Time: 06/27/23  8:00 PM   Specimen: BLOOD LEFT FOREARM  Result Value Ref Range Status   Specimen Description   Final    BLOOD LEFT FOREARM Performed at [facility], [address], [place], [place] [address]  Special Requests   Final    BOTTLES DRAWN AEROBIC AND ANAEROBIC Blood Culture results may not be optimal due to an inadequate volume of blood received in culture bottles Performed at [facility], [address] [place], [place] [address]    Culture   Final    NO GROWTH 1 DAY Performed at [facility], [address], [place], [place] [address]    Report Status PENDING  Incomplete  MRSA Next Gen by PCR, Nasal      Status: None   Collection Time: 06/28/23  1:55 AM   Specimen: Nasal Mucosa; Nasal Swab  Result Value Ref Range Status   MRSA by PCR Next Gen NOT DETECTED NOT DETECTED Final    Comment: (NOTE) The GeneXpert MRSA Assay (FDA approved for NASAL specimens only), is one component of a comprehensive MRSA colonization surveillance program. It is not intended to diagnose MRSA infection nor to guide or monitor treatment for MRSA infections. Test performance is not FDA approved in patients less than 57 years old. Performed at [facility], [address], [place], [place] [address]   Culture, blood (Routine x 2)     Status: None (Preliminary result)   Collection Time: 06/28/23  6:19 AM   Specimen: BLOOD RIGHT FOREARM  Result Value Ref Range Status   Specimen Description   Final    BLOOD RIGHT FOREARM Performed at [facility], [address], [place], [place] [address]    Special Requests   Final    BOTTLES DRAWN AEROBIC AND ANAEROBIC Blood Culture results may not be optimal due to an inadequate volume of blood received in culture bottles Performed at [facility], [address], [place], [place] [address]    Culture   Final    NO GROWTH < 24 HOURS Performed at [facility], [address], [place], [place] [address]    Report Status PENDING  Incomplete      Radiology Studies: MR TIBIA FIBULA LEFT W WO CONTRAST Result Date: 06/28/2023 CLINICAL DATA:  Left leg wound infection. Evaluate for abscess. Patient admits to recreational drug use. Appearance of wound is consistent with possible "TRANQ" exposure; this is in added tip in some street drugs which causes wounds with tissue necrosis. EXAM: MRI OF LOWER LEFT EXTREMITY WITHOUT AND WITH CONTRAST TECHNIQUE: Multiplanar, multisequence MR imaging of the left tibia and fibula was performed both before and after administration of intravenous contrast. CONTRAST:  6mL GADAVIST  GADOBUTROL  1 MMOL/ML IV  SOLN COMPARISON:  left tibia and fibula radiographs 06/27/2023 FINDINGS: Bones/Joint/Cartilage Normal marrow signal throughout the tibia. The cortices are intact. There is very mild increased T2 signal and enhancement within a short segment of the fibula at the junction of the proximal third and middle third of the fibular diaphysis (axial series 11, image 24 and axial series 18, image 24). No periosteal edema and the cortex is intact. This is likely incidental. Ligaments The knee medial collateral ligament and fibular collateral ligament appear grossly intact, although the proximal aspect is incompletely visualized. Muscles and Tendons Normal size of the regional calf musculature. Mild edema without enhancement of the peripheral distal aspect of the lateral head of the gastrocnemius greater than medial head of the gastrocnemius muscles which. No intramuscular fluid collection is seen. No tendon tear. Soft tissues A marker overlies the anterolateral proximal calf at the level of the proximal tibial diaphysis and a second marker overlies a similar anterolateral aspect of the  proximal to mid calf at the level of the mid tibial diaphysis. In between these markers, there is a wound/ulceration of the superficial subcutaneous fat measuring up to approximately 3 mm in depth (axial images 27 through 30), 1.6 cm in transverse dimension, and 2.2 cm in craniocaudal length (sagittal image 25 and coronal images 19 and 20). Mild diffuse left calf subcutaneous fat edema and swelling, greatest within the distal lateral calf where there is mild coalescing fluid at the deep aspect of the subcutaneous fat (axial series 11 images 46 through 64). This subcutaneous fat edema is also greater at the distal anterior calf subcutaneous fat (axial image 55). No walled-off, rim enhancing fluid collection is seen to indicate an abscess. Small partially visualized left knee joint effusion. IMPRESSION: 1. Wound/ulceration of the superficial  subcutaneous fat of the anterolateral proximal to mid calf at the level of the proximal tibial diaphysis. 2. Mild diffuse left calf subcutaneous fat edema and swelling, greatest within the distal lateral calf where there is mild coalescing fluid at the deep aspect of the subcutaneous fat. This is nonspecific and may represent cellulitis. No walled-off, rim enhancing fluid collection is seen to indicate an abscess. 3. No evidence of osteomyelitis. 4. Mild edema without enhancement of the peripheral distal aspect of the lateral head of the gastrocnemius greater than medial head of the gastrocnemius muscles which may represent mild muscle strain Electronically Signed   By: Bertina Broccoli M.D.   On: 06/28/2023 16:03   DG Tibia/Fibula Left Result Date: 06/27/2023 CLINICAL DATA:  Pain EXAM: LEFT TIBIA AND FIBULA - 2 VIEW COMPARISON:  None FINDINGS: There is no evidence of fracture or other focal bone lesions. There is diffuse soft tissue swelling of the lower extremity. No dislocation. IMPRESSION: Diffuse soft tissue swelling of the lower extremity. No acute fracture or dislocation. Electronically Signed   By: Tyron Gallon M.D.   On: 06/27/2023 20:24      LOS: 1 day    Aneita Keens, MD [facility] 06/29/2023, 3:50 PM   If 7PM-7AM, please contact night-coverage [email or web address]

## 2023-06-30 DIAGNOSIS — L03116 Cellulitis of left lower limb: Secondary | ICD-10-CM | POA: Diagnosis not present

## 2023-06-30 NOTE — Plan of Care (Signed)

## 2023-06-30 NOTE — Progress Notes (Signed)
 PROGRESS NOTE    Gabrielle Black  WUJ:[id number] DOB: [birth year] DOA: 06/27/2023 PCP: Patient, No Pcp Per   Brief Narrative: Gabrielle Black is a 33 y.o. female with a history of ADHD and drug abuse.  Patient presented secondary to left lower leg wound check with evidence of cellulitis and central necrosis of wound. IV antibiotics started. Orthopedic surgery consulted with recommendation for plastic surgery consultation. Plastic surgery plans for debridement of wound.   Assessment and Plan:  Necrotic leg wound Cellulitis Wound secondary to IV injection.  Patient started empirically on vancomycin  and cefepime  on admission. Orthopedic surgery consulted, Dr. Cherl Corner, who recommended MRI and wound care consult. MRI without evidence of abscess; ortho recommending plastic surgery consultation. Plastic surgery consulted with plan for debridement on 5/12 -Continue Vancomycin  and Cefepime  -Follow-up blood culture data -Plastic surgery recommendations: debridement on 5/12  IV drug use Patient admits to using Fentanyl, amphetamine, cocaine use.  UDS positive for cocaine.  Concern for withdrawal symptoms precipitating while admitted. Patient with plans to attend outpatient rehabilitation. Suboxone  started for withdrawal symptoms. -Suboxone  once withdrawal symptoms start -Clonidine , Robaxin , Atarax , Naproxen , Bentyl , Imodium    DVT prophylaxis: Lovenox  Code Status:   Code Status: Full Code Family Communication: boyfriend noted to be in bed Disposition Plan: Discharge pending ongoing specialist recommendations and outpatient antibiotics   Consultants:  Orthopedic surgery  Procedures:  None  Antimicrobials: Vancomycin  Cefepime     Subjective: Mild withdrawal symptoms only. No specific concerns from overnight.  Objective: BP (!) 84/42 (BP Location: Right Arm)   Pulse (!) 51   Temp 97.6 F (36.4 C) (Oral)   Resp 16   Ht 5\' 4"  (1.626 m)   Wt 56.7 kg   SpO2 97%   BMI 21.46 kg/m    Examination:  General exam: Appears calm and comfortable     Data Reviewed: I have personally reviewed following labs and imaging studies  CBC Lab Results  Component Value Date   WBC 5.9 06/28/2023   RBC 3.79 (L) 06/28/2023   HGB 11.5 (L) 06/28/2023   HCT 36.0 06/28/2023   MCV 95.0 06/28/2023   MCH 30.3 06/28/2023   PLT 250 06/28/2023   MCHC 31.9 06/28/2023   RDW 13.3 06/28/2023   LYMPHSABS 2.0 06/27/2023   MONOABS 0.5 06/27/2023   EOSABS 0.2 06/27/2023   BASOSABS 0.0 06/27/2023     Last metabolic panel Lab Results  Component Value Date   NA 139 06/28/2023   K 3.3 (L) 06/28/2023   CL 107 06/28/2023   CO2 23 06/28/2023   BUN 9 06/28/2023   CREATININE 0.80 06/28/2023   GLUCOSE 108 (H) 06/28/2023   GFRNONAA >60 06/28/2023   GFRAA 144 10/23/2019   CALCIUM 8.8 (L) 06/28/2023   PHOS 3.8 06/28/2023   PROT 6.6 06/28/2023   ALBUMIN 3.7 06/28/2023   LABGLOB 2.1 07/19/2021   AGRATIO 2.0 07/19/2021   BILITOT 0.4 06/28/2023   ALKPHOS 35 (L) 06/28/2023   AST 18 06/28/2023   ALT 14 06/28/2023   ANIONGAP 9 06/28/2023    GFR: Estimated Creatinine Clearance: 88 mL/min (by C-G formula based on SCr of 0.8 mg/dL).  Recent Results (from the past 240 hours)  Culture, blood (Routine x 2)     Status: None (Preliminary result)   Collection Time: 06/27/23  8:00 PM   Specimen: BLOOD LEFT FOREARM  Result Value Ref Range Status   Specimen Description   Final    BLOOD LEFT FOREARM Performed at [facility], [address]  [place], [place] [address]    Special Requests   Final    BOTTLES DRAWN AEROBIC AND ANAEROBIC Blood Culture results may not be optimal due to an inadequate volume of blood received in culture bottles Performed at [facility], [address], [place], [place] [address]    Culture   Final    NO GROWTH 2 DAYS Performed at [facility], [address], [place], [place] [address]    Report Status PENDING  Incomplete  MRSA  Next Gen by PCR, Nasal     Status: None   Collection Time: 06/28/23  1:55 AM   Specimen: Nasal Mucosa; Nasal Swab  Result Value Ref Range Status   MRSA by PCR Next Gen NOT DETECTED NOT DETECTED Final    Comment: (NOTE) The GeneXpert MRSA Assay (FDA approved for NASAL specimens only), is one component of a comprehensive MRSA colonization surveillance program. It is not intended to diagnose MRSA infection nor to guide or monitor treatment for MRSA infections. Test performance is not FDA approved in patients less than 43 years old. Performed at [facility], [address] [place], [place] [address]   Culture, blood (Routine x 2)     Status: None (Preliminary result)   Collection Time: 06/28/23  6:19 AM   Specimen: BLOOD RIGHT FOREARM  Result Value Ref Range Status   Specimen Description   Final    BLOOD RIGHT FOREARM Performed at [facility], [address], [place], [place] [address]    Special Requests   Final    BOTTLES DRAWN AEROBIC AND ANAEROBIC Blood Culture results may not be optimal due to an inadequate volume of blood received in culture bottles Performed at [facility], [address] [place], [place] [address]    Culture   Final    NO GROWTH 2 DAYS Performed at [facility], [address], [place], [place] [address]    Report Status PENDING  Incomplete      Radiology Studies: No results found.     LOS: 2 days    Aneita Keens, MD [facility] 06/30/2023, 4:05 PM   If 7PM-7AM, please contact night-coverage [email or web address]

## 2023-07-01 DIAGNOSIS — L03116 Cellulitis of left lower limb: Secondary | ICD-10-CM | POA: Diagnosis not present

## 2023-07-01 LAB — CBC
HCT: 39.3 % (ref 36.0–46.0)
Hemoglobin: 12.6 g/dL (ref 12.0–15.0)
MCH: 30.2 pg (ref 26.0–34.0)
MCHC: 32.1 g/dL (ref 30.0–36.0)
MCV: 94.2 fL (ref 80.0–100.0)
Platelets: 229 10*3/uL (ref 150–400)
RBC: 4.17 MIL/uL (ref 3.87–5.11)
RDW: 13.3 % (ref 11.5–15.5)
WBC: 5.7 10*3/uL (ref 4.0–10.5)
nRBC: 0 % (ref 0.0–0.2)

## 2023-07-01 LAB — BASIC METABOLIC PANEL WITH GFR
Anion gap: 8 (ref 5–15)
BUN: 10 mg/dL (ref 6–20)
CO2: 24 mmol/L (ref 22–32)
Calcium: 8.8 mg/dL — ABNORMAL LOW (ref 8.9–10.3)
Chloride: 105 mmol/L (ref 98–111)
Creatinine, Ser: 0.74 mg/dL (ref 0.44–1.00)
GFR, Estimated: >60 mL/min (ref >60)
Glucose, Bld: 93 mg/dL (ref 70–99)
Potassium: 3.8 mmol/L (ref 3.5–5.1)
Sodium: 137 mmol/L (ref 135–145)

## 2023-07-01 NOTE — Progress Notes (Signed)
 PROGRESS NOTE    Gabrielle Black  HKV:[id number] DOB: [birth year] DOA: 06/27/2023 PCP: Patient, No Pcp Per   Brief Narrative: Gabrielle Black is a 33 y.o. female with a history of ADHD and drug abuse.  Patient presented secondary to left lower leg wound check with evidence of cellulitis and central necrosis of wound. IV antibiotics started. Orthopedic surgery consulted with recommendation for plastic surgery consultation. Plastic surgery plans for debridement of wound.   Assessment and Plan:  Necrotic leg wound Cellulitis Wound secondary to IV injection.  Patient started empirically on vancomycin  and cefepime  on admission. Orthopedic surgery consulted, Dr. Cherl Corner, who recommended MRI and wound care consult. MRI without evidence of abscess; ortho recommending plastic surgery consultation. Plastic surgery consulted with plan for debridement on 5/12 -Continue Vancomycin  and Cefepime  -Follow-up blood culture data -Plastic surgery recommendations: debridement planned for 5/12  IV drug use Patient admits to using Fentanyl, amphetamine, cocaine use.  UDS positive for cocaine.  Concern for withdrawal symptoms precipitating while admitted. Patient with plans to attend outpatient rehabilitation. -Discontinue Suboxone  as patient had not had significant withdrawal symptoms -Clonidine , Robaxin , Atarax , Naproxen , Bentyl , Imodium    DVT prophylaxis: Lovenox  Code Status:   Code Status: Full Code Family Communication: None at bedside Disposition Plan: Discharge pending ongoing specialist recommendations and outpatient antibiotics   Consultants:  Orthopedic surgery  Procedures:  None  Antimicrobials: Vancomycin  Cefepime     Subjective: No significant issues overnight.   Objective: BP 108/73 (BP Location: Left Arm)   Pulse 65   Temp 97.9 F (36.6 C) (Oral)   Resp 20   Ht 5\' 4"  (1.626 m)   Wt 56.7 kg   SpO2 100%   BMI 21.46 kg/m   Examination:  General exam: Appears calm and  comfortable Respiratory system: Clear to auscultation. Respiratory effort normal. Cardiovascular system: S1 & S2 heard, RRR. Gastrointestinal system: Abdomen is nondistended, soft and nontender. Normal bowel sounds heard. Central nervous system: Alert and oriented. No focal neurological deficits. Musculoskeletal: Trace BLE edema. No calf tenderness Psychiatry: Judgement and insight appear normal. Mood & affect appropriate.      Data Reviewed: I have personally reviewed following labs and imaging studies  CBC Lab Results  Component Value Date   WBC 5.7 07/01/2023   RBC 4.17 07/01/2023   HGB 12.6 07/01/2023   HCT 39.3 07/01/2023   MCV 94.2 07/01/2023   MCH 30.2 07/01/2023   PLT 229 07/01/2023   MCHC 32.1 07/01/2023   RDW 13.3 07/01/2023   LYMPHSABS 2.0 06/27/2023   MONOABS 0.5 06/27/2023   EOSABS 0.2 06/27/2023   BASOSABS 0.0 06/27/2023     Last metabolic panel Lab Results  Component Value Date   NA 137 07/01/2023   K 3.8 07/01/2023   CL 105 07/01/2023   CO2 24 07/01/2023   BUN 10 07/01/2023   CREATININE 0.74 07/01/2023   GLUCOSE 93 07/01/2023   GFRNONAA >60 07/01/2023   GFRAA 144 10/23/2019   CALCIUM 8.8 (L) 07/01/2023   PHOS 3.8 06/28/2023   PROT 6.6 06/28/2023   ALBUMIN 3.7 06/28/2023   LABGLOB 2.1 07/19/2021   AGRATIO 2.0 07/19/2021   BILITOT 0.4 06/28/2023   ALKPHOS 35 (L) 06/28/2023   AST 18 06/28/2023   ALT 14 06/28/2023   ANIONGAP 8 07/01/2023    GFR: Estimated Creatinine Clearance: 88 mL/min (by C-G formula based on SCr of 0.74 mg/dL).  Recent Results (from the past 240 hours)  Culture, blood (Routine x 2)     Status: None (Preliminary result)  Collection Time: 06/27/23  8:00 PM   Specimen: BLOOD LEFT FOREARM  Result Value Ref Range Status   Specimen Description   Final    BLOOD LEFT FOREARM Performed at [facility], [address], [place], [place] [address]    Special Requests   Final    BOTTLES DRAWN AEROBIC AND ANAEROBIC Blood  Culture results may not be optimal due to an inadequate volume of blood received in culture bottles Performed at [facility], [address], [place], [place] [address]    Culture   Final    NO GROWTH 3 DAYS Performed at [facility], [address], [place], [place] [address]    Report Status PENDING  Incomplete  MRSA Next Gen by PCR, Nasal     Status: None   Collection Time: 06/28/23  1:55 AM   Specimen: Nasal Mucosa; Nasal Swab  Result Value Ref Range Status   MRSA by PCR Next Gen NOT DETECTED NOT DETECTED Final    Comment: (NOTE) The GeneXpert MRSA Assay (FDA approved for NASAL specimens only), is one component of a comprehensive MRSA colonization surveillance program. It is not intended to diagnose MRSA infection nor to guide or monitor treatment for MRSA infections. Test performance is not FDA approved in patients less than 39 years old. Performed at [facility], [address], [place], [place] [address]   Culture, blood (Routine x 2)     Status: None (Preliminary result)   Collection Time: 06/28/23  6:19 AM   Specimen: BLOOD RIGHT FOREARM  Result Value Ref Range Status   Specimen Description   Final    BLOOD RIGHT FOREARM Performed at [facility], [address] [place], [place] [address]    Special Requests   Final    BOTTLES DRAWN AEROBIC AND ANAEROBIC Blood Culture results may not be optimal due to an inadequate volume of blood received in culture bottles Performed at [facility], [address], [place], [place] [address]    Culture   Final    NO GROWTH 3 DAYS Performed at [facility], [address] [place], [place] [address]    Report Status PENDING  Incomplete      Radiology Studies: No results found.     LOS: 3 days    Aneita Keens, MD [facility] 07/01/2023, 2:01 PM   If 7PM-7AM, please contact night-coverage [email or web address]

## 2023-07-01 NOTE — Plan of Care (Signed)

## 2023-07-02 ENCOUNTER — Encounter (HOSPITAL_COMMUNITY)
Admission: EM | Disposition: A | Discharge status: Home / Self Care | Payer: Self-pay | Source: Home / Self Care | Attending: Family Medicine

## 2023-07-02 ENCOUNTER — Other Ambulatory Visit: Payer: Self-pay

## 2023-07-02 ENCOUNTER — Inpatient Hospital Stay (HOSPITAL_COMMUNITY): Payer: MEDICAID | Admitting: Anesthesiology

## 2023-07-02 ENCOUNTER — Encounter (HOSPITAL_COMMUNITY): Payer: Self-pay | Admitting: Internal Medicine

## 2023-07-02 DIAGNOSIS — S81802A Unspecified open wound, left lower leg, initial encounter: Secondary | ICD-10-CM

## 2023-07-02 DIAGNOSIS — L03116 Cellulitis of left lower limb: Secondary | ICD-10-CM | POA: Diagnosis not present

## 2023-07-02 HISTORY — PX: INCISION AND DRAINAGE OF WOUND: SHX1803

## 2023-07-02 SURGERY — IRRIGATION AND DEBRIDEMENT WOUND
Anesthesia: General | Site: Leg Lower | Laterality: Left

## 2023-07-02 MED ORDER — HYDROMORPHONE HCL 1 MG/ML IJ SOLN
INTRAMUSCULAR | Status: AC
  Filled 2023-07-02: qty 1

## 2023-07-02 MED ORDER — FENTANYL CITRATE PF 50 MCG/ML IJ SOSY
25.0000 ug | PREFILLED_SYRINGE | INTRAMUSCULAR | Status: DC | PRN
  Administered 2023-07-02: 50 ug via INTRAVENOUS

## 2023-07-02 MED ORDER — LACTATED RINGERS IV SOLN: INTRAVENOUS | Status: DC

## 2023-07-02 MED ORDER — SODIUM CHLORIDE 0.9 % IV BOLUS
1000.0000 mL | Freq: Once | INTRAVENOUS | Status: AC
  Administered 2023-07-02: 1000 mL via INTRAVENOUS

## 2023-07-02 MED ORDER — KETAMINE HCL 50 MG/5ML IJ SOSY
PREFILLED_SYRINGE | INTRAMUSCULAR | Status: AC
  Filled 2023-07-02: qty 5

## 2023-07-02 MED ORDER — EPHEDRINE SULFATE (PRESSORS) 50 MG/ML IJ SOLN
INTRAMUSCULAR | Status: DC | PRN
  Administered 2023-07-02: 5 mg via INTRAVENOUS

## 2023-07-02 MED ORDER — LIDOCAINE HCL (PF) 2 % IJ SOLN
INTRAMUSCULAR | Status: AC
Start: 2023-07-02 — End: ?
  Filled 2023-07-02: qty 5

## 2023-07-02 MED ORDER — PROPOFOL 10 MG/ML IV BOLUS
INTRAVENOUS | Status: DC | PRN
  Administered 2023-07-02: 200 mg via INTRAVENOUS

## 2023-07-02 MED ORDER — CHLORHEXIDINE GLUCONATE CLOTH 2 % EX PADS
6.0000 | MEDICATED_PAD | Freq: Once | CUTANEOUS | Status: AC
  Administered 2023-07-02: 6 via TOPICAL

## 2023-07-02 MED ORDER — LIDOCAINE HCL 1 % IJ SOLN
INTRAMUSCULAR | Status: DC | PRN
  Administered 2023-07-02: 60 mg via INTRADERMAL

## 2023-07-02 MED ORDER — ONDANSETRON HCL 4 MG/2ML IJ SOLN
INTRAMUSCULAR | Status: AC
  Filled 2023-07-02: qty 2

## 2023-07-02 MED ORDER — BUPIVACAINE-EPINEPHRINE (PF) 0.25% -1:200000 IJ SOLN
INTRAMUSCULAR | Status: AC
  Filled 2023-07-02: qty 30

## 2023-07-02 MED ORDER — FENTANYL CITRATE (PF) 100 MCG/2ML IJ SOLN
INTRAMUSCULAR | Status: AC
  Filled 2023-07-02: qty 2

## 2023-07-02 MED ORDER — DEXAMETHASONE SODIUM PHOSPHATE 10 MG/ML IJ SOLN
INTRAMUSCULAR | Status: AC
Start: 2023-07-02 — End: ?
  Filled 2023-07-02: qty 1

## 2023-07-02 MED ORDER — DEXMEDETOMIDINE HCL IN NACL 80 MCG/20ML IV SOLN
INTRAVENOUS | Status: DC | PRN
  Administered 2023-07-02: 4 ug via INTRAVENOUS
  Administered 2023-07-02: 8 ug via INTRAVENOUS

## 2023-07-02 MED ORDER — CHLORHEXIDINE GLUCONATE CLOTH 2 % EX PADS: 6.0000 | MEDICATED_PAD | Freq: Once | CUTANEOUS | Status: DC

## 2023-07-02 MED ORDER — MIDAZOLAM HCL 5 MG/5ML IJ SOLN
INTRAMUSCULAR | Status: DC | PRN
  Administered 2023-07-02: 2 mg via INTRAVENOUS

## 2023-07-02 MED ORDER — OXYCODONE HCL 5 MG PO TABS: 5.0000 mg | ORAL_TABLET | Freq: Once | ORAL | Status: DC | PRN

## 2023-07-02 MED ORDER — DEXAMETHASONE SODIUM PHOSPHATE 10 MG/ML IJ SOLN
INTRAMUSCULAR | Status: DC | PRN
  Administered 2023-07-02: 4 mg via INTRAVENOUS

## 2023-07-02 MED ORDER — HYDROMORPHONE HCL 1 MG/ML IJ SOLN
0.2500 mg | INTRAMUSCULAR | Status: DC | PRN
  Administered 2023-07-02: 0.5 mg via INTRAVENOUS

## 2023-07-02 MED ORDER — MELATONIN 5 MG PO TABS: 5.0000 mg | ORAL_TABLET | Freq: Once | ORAL | Status: DC | PRN

## 2023-07-02 MED ORDER — FENTANYL CITRATE (PF) 100 MCG/2ML IJ SOLN
INTRAMUSCULAR | Status: DC | PRN
  Administered 2023-07-02 (×2): 50 ug via INTRAVENOUS

## 2023-07-02 MED ORDER — ONDANSETRON HCL 4 MG/2ML IJ SOLN: 4.0000 mg | Freq: Once | INTRAMUSCULAR | Status: DC | PRN

## 2023-07-02 MED ORDER — OXYCODONE HCL 5 MG/5ML PO SOLN: 5.0000 mg | Freq: Once | ORAL | Status: DC | PRN

## 2023-07-02 MED ORDER — CHLORHEXIDINE GLUCONATE 0.12 % MT SOLN
15.0000 mL | Freq: Once | OROMUCOSAL | Status: AC
  Administered 2023-07-02: 15 mL via OROMUCOSAL

## 2023-07-02 MED ORDER — PHENYLEPHRINE HCL-NACL 20-0.9 MG/250ML-% IV SOLN
INTRAVENOUS | Status: DC | PRN
  Administered 2023-07-02: 50 ug/min via INTRAVENOUS
  Administered 2023-07-02: 160 ug via INTRAVENOUS

## 2023-07-02 MED ORDER — PROPOFOL 10 MG/ML IV BOLUS
INTRAVENOUS | Status: AC
  Filled 2023-07-02: qty 20

## 2023-07-02 MED ORDER — ORAL CARE MOUTH RINSE: 15.0000 mL | OROMUCOSAL | Status: DC | PRN

## 2023-07-02 MED ORDER — MIDAZOLAM HCL 2 MG/2ML IJ SOLN
INTRAMUSCULAR | Status: AC
  Filled 2023-07-02: qty 2

## 2023-07-02 MED ORDER — BUPIVACAINE-EPINEPHRINE 0.25% -1:200000 IJ SOLN
INTRAMUSCULAR | Status: DC | PRN
  Administered 2023-07-02: 7 mL

## 2023-07-02 MED ORDER — FENTANYL CITRATE PF 50 MCG/ML IJ SOSY
PREFILLED_SYRINGE | INTRAMUSCULAR | Status: AC
  Filled 2023-07-02: qty 2

## 2023-07-02 MED ORDER — CEFAZOLIN SODIUM-DEXTROSE 2-4 GM/100ML-% IV SOLN
2.0000 g | INTRAVENOUS | Status: AC
  Administered 2023-07-02: 2 g via INTRAVENOUS
  Filled 2023-07-02: qty 100

## 2023-07-02 MED ORDER — GLYCOPYRROLATE PF 0.2 MG/ML IJ SOSY
PREFILLED_SYRINGE | INTRAMUSCULAR | Status: DC | PRN
  Administered 2023-07-02 (×2): .1 mg via INTRAVENOUS

## 2023-07-02 SURGICAL SUPPLY — 37 items
BAG COUNTER SPONGE SURGICOUNT (BAG) IMPLANT
BNDG ELASTIC 4INX 5YD STR LF (GAUZE/BANDAGES/DRESSINGS) ×1 IMPLANT
BNDG ELASTIC 4X5.8 VLCR NS LF (GAUZE/BANDAGES/DRESSINGS) IMPLANT
BNDG GAUZE DERMACEA FLUFF 4 (GAUZE/BANDAGES/DRESSINGS) IMPLANT
CNTNR URN SCR LID CUP LEK RST (MISCELLANEOUS) ×1 IMPLANT
COVER SURGICAL LIGHT HANDLE (MISCELLANEOUS) IMPLANT
DRAPE EXTREMITY T 121X128X90 (DISPOSABLE) IMPLANT
DRAPE INCISE IOBAN 66X45 STRL (DRAPES) ×1 IMPLANT
DRAPE LAPAROTOMY T 102X78X121 (DRAPES) ×1 IMPLANT
DRAPE SHEET LG 3/4 BI-LAMINATE (DRAPES) IMPLANT
DRAPE SURG ORHT 6 SPLT 77X108 (DRAPES) IMPLANT
DRAPE UTILITY XL STRL (DRAPES) ×1 IMPLANT
DRSG ADAPTIC 3X8 NADH LF (GAUZE/BANDAGES/DRESSINGS) ×1 IMPLANT
DRSG EMULSION OIL 3X16 NADH (GAUZE/BANDAGES/DRESSINGS) IMPLANT
DRSG HYDROCOLLOID 4X4 (GAUZE/BANDAGES/DRESSINGS) IMPLANT
ELECT REM PT RETURN 15FT ADLT (MISCELLANEOUS) ×1 IMPLANT
GAUZE PAD ABD 8X10 STRL (GAUZE/BANDAGES/DRESSINGS) IMPLANT
GAUZE SPONGE 4X4 12PLY STRL (GAUZE/BANDAGES/DRESSINGS) ×1 IMPLANT
GLOVE BIO SURGEON STRL SZ 6.5 (GLOVE) ×1 IMPLANT
GOWN STRL REUS W/ TWL LRG LVL3 (GOWN DISPOSABLE) ×2 IMPLANT
KIT BASIN OR (CUSTOM PROCEDURE TRAY) ×1 IMPLANT
KIT TURNOVER KIT A (KITS) IMPLANT
NDL HYPO 22X1.5 SAFETY MO (MISCELLANEOUS) IMPLANT
NEEDLE HYPO 22X1.5 SAFETY MO (MISCELLANEOUS) IMPLANT
PACK GENERAL/GYN (CUSTOM PROCEDURE TRAY) ×1 IMPLANT
PAD CAST 4YDX4 CTTN HI CHSV (CAST SUPPLIES) ×1 IMPLANT
SET HNDPC FAN SPRY TIP SCT (DISPOSABLE) IMPLANT
SOLUTION SCRB POV-IOD 4OZ 7.5% (MISCELLANEOUS) ×1 IMPLANT
SPIKE FLUID TRANSFER (MISCELLANEOUS) IMPLANT
STAPLER SKIN PROX 35W (STAPLE) ×1 IMPLANT
SUT MNCRL AB 4-0 PS2 18 (SUTURE) ×1 IMPLANT
SUT SILK 4 0 PS 2 (SUTURE) IMPLANT
SUT VIC AB 5-0 PS2 18 (SUTURE) IMPLANT
SWAB COLLECTION DEVICE MRSA (MISCELLANEOUS) IMPLANT
SWAB CULTURE ESWAB REG 1ML (MISCELLANEOUS) ×1 IMPLANT
SYR CONTROL 10ML LL (SYRINGE) IMPLANT
TOWEL OR 17X26 10 PK STRL BLUE (TOWEL DISPOSABLE) ×1 IMPLANT

## 2023-07-02 NOTE — Plan of Care (Signed)

## 2023-07-02 NOTE — Interval H&P Note (Signed)
 History and Physical Interval Note: No change in exam or indication for surgery All questions answered to the patients satisfaction. Site marked with her concurrence Will proceed with debridement of the left leg at her request  07/02/2023 3:54 PM  Gabrielle Black  has presented today for surgery, with the diagnosis of cellulitis.  The various methods of treatment have been discussed with the patient and family. After consideration of risks, benefits and other options for treatment, the patient has consented to  Procedure(s): IRRIGATION AND DEBRIDEMENT WOUND (Left) as a surgical intervention.  The patient's history has been reviewed, patient examined, no change in status, stable for surgery.  I have reviewed the patient's chart and labs.  Questions were answered to the patient's satisfaction.     [name]

## 2023-07-02 NOTE — Anesthesia Preprocedure Evaluation (Addendum)
 Anesthesia Evaluation  Patient identified by MRN, date of birth, ID band Patient awake    Reviewed: Allergy & Precautions, NPO status , Patient's Chart, lab work & pertinent test results  Airway Mallampati: II  TM Distance: >3 FB     Dental  (+) Dental Advisory Given, Poor Dentition, Chipped,    Pulmonary former smoker   Pulmonary exam normal breath sounds clear to auscultation       Cardiovascular negative cardio ROS Normal cardiovascular exam Rhythm:Regular Rate:Normal     Neuro/Psych  PSYCHIATRIC DISORDERS  Depression    ADHDnegative neurological ROS     GI/Hepatic negative GI ROS,,,(+)     substance abuse  cocaine use and IV drug useLast use a couple of days ago   Endo/Other  negative endocrine ROS    Renal/GU negative Renal ROS     Musculoskeletal Cellulitis left lower leg   Abdominal   Peds  Hematology negative hematology ROS (+)   Anesthesia Other Findings   Reproductive/Obstetrics negative OB ROS                             Anesthesia Physical Anesthesia Plan  ASA: 2  Anesthesia Plan: General   Post-op Pain Management: Minimal or no pain anticipated   Induction: Intravenous  PONV Risk Score and Plan: 4 or greater and Treatment may vary due to age or medical condition, Midazolam, Ondansetron  and Dexamethasone  Airway Management Planned: LMA  Additional Equipment: None  Intra-op Plan:   Post-operative Plan: Extubation in OR  Informed Consent: I have reviewed the patients History and Physical, chart, labs and discussed the procedure including the risks, benefits and alternatives for the proposed anesthesia with the patient or authorized representative who has indicated his/her understanding and acceptance.     Dental advisory given  Plan Discussed with: CRNA and Anesthesiologist  Anesthesia Plan Comments:         Anesthesia Quick Evaluation

## 2023-07-02 NOTE — Progress Notes (Signed)
 PROGRESS NOTE    Gabrielle Black  ZOX:[id number] DOB: 1990-07-30 DOA: 06/27/2023 PCP: Patient, No Pcp Per   Brief Narrative: Gabrielle Black is a 33 y.o. female with a history of ADHD and drug abuse.  Patient presented secondary to left lower leg wound check with evidence of cellulitis and central necrosis of wound. IV antibiotics started. Orthopedic surgery consulted with recommendation for plastic surgery consultation. Plastic surgery plans for debridement of wound.   Assessment and Plan:  Necrotic leg wound Cellulitis Wound secondary to IV injection.  Patient started empirically on vancomycin  and cefepime  on admission. Orthopedic surgery consulted, Dr. Cherl Corner, who recommended MRI and wound care consult. MRI without evidence of abscess; ortho recommending plastic surgery consultation. Plastic surgery consulted with plan for debridement on 5/12. Blood cultures with no growth. -Continue Vancomycin  and Cefepime  -Plastic surgery recommendations: debridement planned for 5/12  IV drug use Patient admits to using Fentanyl, amphetamine, cocaine use.  UDS positive for cocaine.  Concern for withdrawal symptoms precipitating while admitted. Patient with plans to attend outpatient rehabilitation. -Complete clonidine  taper -Continue Robaxin , Atarax , Naproxen , Bentyl , Imodium  as needed  Hypotension Mild. Currently NPO. Afebrile. MAP 66. -NS bolus over 4 hours   DVT prophylaxis: Lovenox  Code Status:   Code Status: Full Code Family Communication: None at bedside. Significant other in bed with patient. Disposition Plan: Discharge pending ongoing specialist recommendations and outpatient antibiotics   Consultants:  Orthopedic surgery Plastic surgery  Procedures:  None  Antimicrobials: Vancomycin  Cefepime     Subjective: No issues noted from overnight.  Objective: BP (!) 85/55 (BP Location: Left Arm)   Pulse (!) 48   Temp 98.3 F (36.8 C) (Oral)   Resp 18   Ht 5\' 4"  (1.626 m)   Wt  56.7 kg   SpO2 97%   BMI 21.46 kg/m   Examination:  General exam: Appears calm and comfortable Respiratory system: Respiratory effort normal.   Data Reviewed: I have personally reviewed following labs and imaging studies  CBC Lab Results  Component Value Date   WBC 5.7 07/01/2023   RBC 4.17 07/01/2023   HGB 12.6 07/01/2023   HCT 39.3 07/01/2023   MCV 94.2 07/01/2023   MCH 30.2 07/01/2023   PLT 229 07/01/2023   MCHC 32.1 07/01/2023   RDW 13.3 07/01/2023   LYMPHSABS 2.0 06/27/2023   MONOABS 0.5 06/27/2023   EOSABS 0.2 06/27/2023   BASOSABS 0.0 06/27/2023     Last metabolic panel Lab Results  Component Value Date   NA 137 07/01/2023   K 3.8 07/01/2023   CL 105 07/01/2023   CO2 24 07/01/2023   BUN 10 07/01/2023   CREATININE 0.74 07/01/2023   GLUCOSE 93 07/01/2023   GFRNONAA >60 07/01/2023   GFRAA 144 10/23/2019   CALCIUM 8.8 (L) 07/01/2023   PHOS 3.8 06/28/2023   PROT 6.6 06/28/2023   ALBUMIN 3.7 06/28/2023   LABGLOB 2.1 07/19/2021   AGRATIO 2.0 07/19/2021   BILITOT 0.4 06/28/2023   ALKPHOS 35 (L) 06/28/2023   AST 18 06/28/2023   ALT 14 06/28/2023   ANIONGAP 8 07/01/2023    GFR: Estimated Creatinine Clearance: 88 mL/min (by C-G formula based on SCr of 0.74 mg/dL).  Recent Results (from the past 240 hours)  Culture, blood (Routine x 2)     Status: None (Preliminary result)   Collection Time: 06/27/23  8:00 PM   Specimen: BLOOD LEFT FOREARM  Result Value Ref Range Status   Specimen Description   Final    BLOOD LEFT FOREARM  Performed at [facility], [address], [place], [place] [address]    Special Requests   Final    BOTTLES DRAWN AEROBIC AND ANAEROBIC Blood Culture results may not be optimal due to an inadequate volume of blood received in culture bottles Performed at [facility], [address], [place], [place] [address]    Culture   Final    NO GROWTH 4 DAYS Performed at [facility], [address]  [place], [place] [address]    Report Status PENDING  Incomplete  MRSA Next Gen by PCR, Nasal     Status: None   Collection Time: 06/28/23  1:55 AM   Specimen: Nasal Mucosa; Nasal Swab  Result Value Ref Range Status   MRSA by PCR Next Gen NOT DETECTED NOT DETECTED Final    Comment: (NOTE) The GeneXpert MRSA Assay (FDA approved for NASAL specimens only), is one component of a comprehensive MRSA colonization surveillance program. It is not intended to diagnose MRSA infection nor to guide or monitor treatment for MRSA infections. Test performance is not FDA approved in patients less than 66 years old. Performed at [facility], [address], [place], [place] [address]   Culture, blood (Routine x 2)     Status: None (Preliminary result)   Collection Time: 06/28/23  6:19 AM   Specimen: BLOOD RIGHT FOREARM  Result Value Ref Range Status   Specimen Description   Final    BLOOD RIGHT FOREARM Performed at [facility], [address] [place], [place] [address]    Special Requests   Final    BOTTLES DRAWN AEROBIC AND ANAEROBIC Blood Culture results may not be optimal due to an inadequate volume of blood received in culture bottles Performed at [facility], [address], [place], [place] [address]    Culture   Final    NO GROWTH 4 DAYS Performed at [facility], [address], [place], [place] [address]    Report Status PENDING  Incomplete      Radiology Studies: No results found.     LOS: 4 days    Aneita Keens, MD [facility] 07/02/2023, 10:08 AM   If 7PM-7AM, please contact night-coverage [email or web address]

## 2023-07-02 NOTE — Op Note (Signed)
 DATE OF OPERATION: 07/02/2023  LOCATION: [facility]  PREOPERATIVE DIAGNOSIS: Left pretibial wound  POSTOPERATIVE DIAGNOSIS: Same  PROCEDURE: Debridement of left pretibial wound  SURGEON: Kurtis Philips, MD  ASSISTANT: Matt Scheeler  EBL: 5 cc  CONDITION: Stable  COMPLICATIONS: None  INDICATION: The patient, Gabrielle Black, is a 33 y.o. female born on 1991-01-27, is here for treatment of a necrotic wound on the left pretibial region.   PROCEDURE DETAILS:  The patient was seen prior to surgery and marked.  The IV antibiotics were given. The patient was taken to the operating room and given a general anesthetic. A standard time out was performed and all information was confirmed by those in the room. SCDs were placed.   The leg was prepped and draped in the usual sterile manner.  The necrotic debris was debrided sharply with a scalpel.  The total area debrided was 3 cm x 1.5 cm x 1 cm deep.  Tissues debrided were skin and subcutaneous fat.  Debridement was carried down to bleeding subcutaneous fat.  Bleeding was controlled with the electrocautery.  The wound was injected with quarter percent Marcaine with epinephrine.  Sterile dressings were were applied.  The patient was awakened from anesthesia without incident transferred to the recovery room in good condition all instrument needle sponge counts were reported as correct and there were no complications appreciated. The patient was allowed to wake up and taken to recovery room in stable condition at the end of the case. The family was notified at the end of the case.   The advanced practice practitioner (APP) assisted throughout the case.  The APP was essential in retraction and counter traction when needed to make the case progress smoothly.  This retraction and assistance made it possible to see the tissue plans for the procedure.  The assistance was needed for blood control, tissue re-approximation and assisted with closure of the  incision site.

## 2023-07-02 NOTE — Transfer of Care (Signed)
 Immediate Anesthesia Transfer of Care Note  Patient: Gabrielle Black  Procedure(s) Performed: IRRIGATION AND DEBRIDEMENT WOUND (Left: Leg Lower)  Patient Location: PACU  Anesthesia Type:General  Level of Consciousness: awake, alert , and oriented  Airway & Oxygen Therapy: Patient Spontanous Breathing and Patient connected to face mask oxygen  Post-op Assessment: Report given to RN and Post -op Vital signs reviewed and stable  Post vital signs: Reviewed and stable  Last Vitals:  Vitals Value Taken Time  BP 114/61 07/02/23 1650  Temp    Pulse 99 07/02/23 1652  Resp 18 07/02/23 1652  SpO2 100 % 07/02/23 1652  Vitals shown include unfiled device data.  Last Pain:  Vitals:   07/02/23 1432  TempSrc:   PainSc: 0-No pain         Complications: No notable events documented.

## 2023-07-02 NOTE — Progress Notes (Signed)
 Patient underwent debridement of left pretibial wound with Dr. Carolynne Citron this evening on 07/02/2023.  Recommend daily dressing changes.  Wound care orders placed:  Cleanse the wound with normal saline or Vashe soaked gauze, allow gauze to sit on open wound for approximately 5 minutes.  Then apply Adaptic mesh, K-Y jelly followed by lightly moistened 4 x 4 gauze, cover with a dry gauze, wrap with 4 inch Kerlix and 4 inch Ace wrap.  Patient is stable for discharge from plastic surgery stands, recommend follow-up outpatient in 1 week for ongoing evaluation and wound care management.  If patient unwilling to travel to [facility] for ongoing care, may benefit from referral to outpatient [facility] wound care center given proximity to patient's home.

## 2023-07-02 NOTE — Anesthesia Postprocedure Evaluation (Signed)
 Anesthesia Post Note  Patient: Gabrielle Black  Procedure(s) Performed: IRRIGATION AND DEBRIDEMENT WOUND (Left: Leg Lower)     Patient location during evaluation: PACU Anesthesia Type: General Level of consciousness: awake and alert and oriented Pain management: pain level controlled Vital Signs Assessment: post-procedure vital signs reviewed and stable Respiratory status: spontaneous breathing, nonlabored ventilation and respiratory function stable Cardiovascular status: blood pressure returned to baseline and stable Postop Assessment: no apparent nausea or vomiting Anesthetic complications: no   No notable events documented.  Last Vitals:  Vitals:   07/02/23 1304 07/02/23 1700  BP: 129/64 126/77  Pulse: (!) 103 80  Resp: 20 15  Temp: 36.6 C   SpO2: 100% 100%    Last Pain:  Vitals:   07/02/23 1700  TempSrc:   PainSc: 10-Worst pain ever                 Maaz Spiering A.

## 2023-07-03 ENCOUNTER — Encounter (HOSPITAL_COMMUNITY): Payer: Self-pay | Admitting: Plastic Surgery

## 2023-07-03 DIAGNOSIS — L03116 Cellulitis of left lower limb: Secondary | ICD-10-CM | POA: Diagnosis not present

## 2023-07-03 DIAGNOSIS — I959 Hypotension, unspecified: Secondary | ICD-10-CM | POA: Insufficient documentation

## 2023-07-03 LAB — CULTURE, BLOOD (ROUTINE X 2)
Culture: NO GROWTH
Culture: NO GROWTH

## 2023-07-03 MED ORDER — CEFADROXIL 500 MG PO CAPS: 500.0000 mg | ORAL_CAPSULE | Freq: Two times a day (BID) | ORAL | 0 refills | Status: AC

## 2023-07-03 MED ORDER — DOXYCYCLINE HYCLATE 100 MG PO TABS: 100.0000 mg | ORAL_TABLET | Freq: Two times a day (BID) | ORAL | 0 refills | Status: AC

## 2023-07-03 NOTE — Discharge Summary (Signed)
 Physician Discharge Summary   Patient: Gabrielle Black MRN: [id number] DOB: [birth year]  Admit date:     06/27/2023  Discharge date: 07/03/23  Discharge Physician: Aneita Keens, MD   PCP: Patient, No Pcp Per   Recommendations at discharge:  Plastic surgery follow-up for wound check  Discharge Diagnoses: Principal Problem:   Cellulitis Active Problems:   IV drug user  Resolved Problems:   * No resolved hospital problems. [facility] Course: Gabrielle Black is a 33 y.o. female with a history of ADHD and drug abuse.  Patient presented secondary to left lower leg wound check with evidence of cellulitis and central necrosis of wound. IV antibiotics started. Orthopedic surgery consulted with recommendation for plastic surgery consultation. Plastic surgery performed debridement on 5/12. Patient discharged with antibiotics and will follow-up with plastic surgery.  Assessment and Plan:  Necrotic leg wound Cellulitis Wound secondary to IV injection.  Patient started empirically on vancomycin  and cefepime  on admission. Orthopedic surgery consulted, Dr. Cherl Corner, who recommended MRI and wound care consult. MRI without evidence of abscess; ortho recommending plastic surgery consultation. Plastic surgery consulted with plan for debridement on 5/12. Blood cultures with no growth. -Continue Vancomycin  and Cefepime  -Plastic surgery recommendations: debridement planned for 5/12   IV drug use Patient admits to using Fentanyl, amphetamine, cocaine use.  UDS positive for cocaine.  Concern for withdrawal symptoms precipitating while admitted. Patient with plans to attend outpatient rehabilitation. Resources provided.   Hypotension Mild. Currently NPO. Afebrile. MAP 66. Resolved with IV fluids.   Consultants:  Orthopedic surgery Plastic surgery   Procedures:  None  Disposition: Home Diet recommendation: Regular diet   DISCHARGE MEDICATION: Allergies as of 07/03/2023   No Known Allergies       Medication List     STOP taking these medications    doxycycline 100 MG capsule Commonly known as: VIBRAMYCIN Replaced by: doxycycline 100 MG tablet       TAKE these medications    acetaminophen  325 MG tablet Commonly known as: TYLENOL  Take 650 mg by mouth daily as needed for mild pain (pain score 1-3) or moderate pain (pain score 4-6).   cefadroxil 500 MG capsule Commonly known as: DURICEF Take 1 capsule (500 mg total) by mouth 2 (two) times daily for 4 days.   doxycycline 100 MG tablet Commonly known as: VIBRA-TABS Take 1 tablet (100 mg total) by mouth 2 (two) times daily for 4 days. Replaces: doxycycline 100 MG capsule   mupirocin ointment 2 % Commonly known as: BACTROBAN Apply 1 Application topically every other day.   naloxone  4 MG/0.1ML Liqd nasal spray kit Commonly known as: NARCAN  Place 0.4 mg into the nose once.        Follow-up Information     [name], MD Follow up in 1 week(s).   Specialty: Plastic Surgery Why: For wound re-check Contact information: [address] [place] [address] [phone]                Discharge Exam: BP 107/64 (BP Location: Right Arm)   Pulse 61   Temp 98.8 F (37.1 C) (Oral)   Resp 16   Ht 5\' 4"  (1.626 m)   Wt 56.7 kg   SpO2 97%   BMI 21.46 kg/m   General exam: Appears calm and comfortable Respiratory system: Respiratory effort normal. Skin: Left leg in surgical dressing Psychiatry: Judgement and insight appear normal. Mood & affect appropriate.   Condition at discharge: stable  The results of significant diagnostics  from this hospitalization (including imaging, microbiology, ancillary and laboratory) are listed below for reference.   Imaging Studies: MR TIBIA FIBULA LEFT W WO CONTRAST Result Date: 06/28/2023 CLINICAL DATA:  Left leg wound infection. Evaluate for abscess. Patient admits to recreational drug use. Appearance of wound is consistent with possible "TRANQ" exposure;  this is in added tip in some street drugs which causes wounds with tissue necrosis. EXAM: MRI OF LOWER LEFT EXTREMITY WITHOUT AND WITH CONTRAST TECHNIQUE: Multiplanar, multisequence MR imaging of the left tibia and fibula was performed both before and after administration of intravenous contrast. CONTRAST:  6mL GADAVIST  GADOBUTROL  1 MMOL/ML IV SOLN COMPARISON:  left tibia and fibula radiographs 06/27/2023 FINDINGS: Bones/Joint/Cartilage Normal marrow signal throughout the tibia. The cortices are intact. There is very mild increased T2 signal and enhancement within a short segment of the fibula at the junction of the proximal third and middle third of the fibular diaphysis (axial series 11, image 24 and axial series 18, image 24). No periosteal edema and the cortex is intact. This is likely incidental. Ligaments The knee medial collateral ligament and fibular collateral ligament appear grossly intact, although the proximal aspect is incompletely visualized. Muscles and Tendons Normal size of the regional calf musculature. Mild edema without enhancement of the peripheral distal aspect of the lateral head of the gastrocnemius greater than medial head of the gastrocnemius muscles which. No intramuscular fluid collection is seen. No tendon tear. Soft tissues A marker overlies the anterolateral proximal calf at the level of the proximal tibial diaphysis and a second marker overlies a similar anterolateral aspect of the proximal to mid calf at the level of the mid tibial diaphysis. In between these markers, there is a wound/ulceration of the superficial subcutaneous fat measuring up to approximately 3 mm in depth (axial images 27 through 30), 1.6 cm in transverse dimension, and 2.2 cm in craniocaudal length (sagittal image 25 and coronal images 19 and 20). Mild diffuse left calf subcutaneous fat edema and swelling, greatest within the distal lateral calf where there is mild coalescing fluid at the deep aspect of the  subcutaneous fat (axial series 11 images 46 through 64). This subcutaneous fat edema is also greater at the distal anterior calf subcutaneous fat (axial image 55). No walled-off, rim enhancing fluid collection is seen to indicate an abscess. Small partially visualized left knee joint effusion. IMPRESSION: 1. Wound/ulceration of the superficial subcutaneous fat of the anterolateral proximal to mid calf at the level of the proximal tibial diaphysis. 2. Mild diffuse left calf subcutaneous fat edema and swelling, greatest within the distal lateral calf where there is mild coalescing fluid at the deep aspect of the subcutaneous fat. This is nonspecific and may represent cellulitis. No walled-off, rim enhancing fluid collection is seen to indicate an abscess. 3. No evidence of osteomyelitis. 4. Mild edema without enhancement of the peripheral distal aspect of the lateral head of the gastrocnemius greater than medial head of the gastrocnemius muscles which may represent mild muscle strain Electronically Signed   By: Bertina Broccoli M.D.   On: 06/28/2023 16:03   DG Tibia/Fibula Left Result Date: 06/27/2023 CLINICAL DATA:  Pain EXAM: LEFT TIBIA AND FIBULA - 2 VIEW COMPARISON:  None FINDINGS: There is no evidence of fracture or other focal bone lesions. There is diffuse soft tissue swelling of the lower extremity. No dislocation. IMPRESSION: Diffuse soft tissue swelling of the lower extremity. No acute fracture or dislocation. Electronically Signed   By: Tyron Gallon M.D.   On:  06/27/2023 20:24    Microbiology: Results for orders placed or performed during the hospital encounter of 06/27/23  Culture, blood (Routine x 2)     Status: None   Collection Time: 06/27/23  8:00 PM   Specimen: BLOOD LEFT FOREARM  Result Value Ref Range Status   Specimen Description   Final    BLOOD LEFT FOREARM Performed at [facility], [address] [place], [place] [address]    Special Requests   Final    BOTTLES DRAWN  AEROBIC AND ANAEROBIC Blood Culture results may not be optimal due to an inadequate volume of blood received in culture bottles Performed at [facility], [address] [place], [place] [address]    Culture   Final    NO GROWTH 5 DAYS Performed at [facility], [address], [place], [place] [address]    Report Status 07/03/2023 FINAL  Final  MRSA Next Gen by PCR, Nasal     Status: None   Collection Time: 06/28/23  1:55 AM   Specimen: Nasal Mucosa; Nasal Swab  Result Value Ref Range Status   MRSA by PCR Next Gen NOT DETECTED NOT DETECTED Final    Comment: (NOTE) The GeneXpert MRSA Assay (FDA approved for NASAL specimens only), is one component of a comprehensive MRSA colonization surveillance program. It is not intended to diagnose MRSA infection nor to guide or monitor treatment for MRSA infections. Test performance is not FDA approved in patients less than 26 years old. Performed at [facility], [address], [place], [place] [address]   Culture, blood (Routine x 2)     Status: None   Collection Time: 06/28/23  6:19 AM   Specimen: BLOOD RIGHT FOREARM  Result Value Ref Range Status   Specimen Description   Final    BLOOD RIGHT FOREARM Performed at [facility], [address], [place], [place] [address]    Special Requests   Final    BOTTLES DRAWN AEROBIC AND ANAEROBIC Blood Culture results may not be optimal due to an inadequate volume of blood received in culture bottles Performed at [facility], [address], [place], [place] [address]    Culture   Final    NO GROWTH 5 DAYS Performed at [facility], [address] [place], [place] [address]    Report Status 07/03/2023 FINAL  Final    Labs: CBC: Recent Labs  Lab 06/27/23 2012 06/28/23 0617 07/01/23 1009  WBC 6.6 5.9 5.7  NEUTROABS 3.9  --   --   HGB 12.0 11.5* 12.6  HCT 36.4 36.0 39.3  MCV 91.5 95.0 94.2  PLT 230 250 229    Basic Metabolic Panel: Recent Labs  Lab 06/27/23 2012 06/28/23 0617 07/01/23 1009  NA 139 139 137  K 4.0 3.3* 3.8  CL 101 107 105  CO2 25 23 24   GLUCOSE 77 108* 93  BUN 10 9 10   CREATININE 0.82 0.80 0.74  CALCIUM 10.0 8.8* 8.8*  MG  --  2.1  --   PHOS  --  3.8  --    Liver Function Tests: Recent Labs  Lab 06/27/23 2012 06/28/23 0617  AST 23 18  ALT 13 14  ALKPHOS 49 35*  BILITOT 0.4 0.4  PROT 7.4 6.6  ALBUMIN 4.8 3.7    Discharge time spent: 35 minutes.  Signed: Aneita Keens, MD [facility] 07/03/2023

## 2023-07-12 ENCOUNTER — Telehealth: Payer: Self-pay | Admitting: Plastic Surgery

## 2023-07-12 NOTE — Telephone Encounter (Signed)
 Carolynne Citron operated on this patient about 2 weeks ago. He would like her to come in for evaluation of her left leg wound///tried to call just rings nvmail set up

## 2023-07-21 ENCOUNTER — Telehealth: Payer: MEDICAID

## 2023-07-28 ENCOUNTER — Encounter (HOSPITAL_BASED_OUTPATIENT_CLINIC_OR_DEPARTMENT_OTHER): Payer: Self-pay | Admitting: *Deleted

## 2023-07-28 ENCOUNTER — Other Ambulatory Visit: Payer: Self-pay

## 2023-07-28 ENCOUNTER — Emergency Department (HOSPITAL_BASED_OUTPATIENT_CLINIC_OR_DEPARTMENT_OTHER)
Admission: EM | Admit: 2023-07-28 | Discharge: 2023-07-28 | Disposition: A | Discharge status: Home / Self Care | Payer: MEDICAID | Attending: Emergency Medicine | Admitting: Emergency Medicine

## 2023-07-28 DIAGNOSIS — D72829 Elevated white blood cell count, unspecified: Secondary | ICD-10-CM | POA: Insufficient documentation

## 2023-07-28 DIAGNOSIS — N390 Urinary tract infection, site not specified: Secondary | ICD-10-CM | POA: Insufficient documentation

## 2023-07-28 DIAGNOSIS — Z4889 Encounter for other specified surgical aftercare: Secondary | ICD-10-CM | POA: Diagnosis not present

## 2023-07-28 LAB — COMPREHENSIVE METABOLIC PANEL WITH GFR
ALT: 13 U/L (ref 0–44)
AST: 20 U/L (ref 15–41)
Albumin: 4.7 g/dL (ref 3.5–5.0)
Alkaline Phosphatase: 55 U/L (ref 38–126)
Anion gap: 12 (ref 5–15)
BUN: 10 mg/dL (ref 6–20)
CO2: 26 mmol/L (ref 22–32)
Calcium: 9.8 mg/dL (ref 8.9–10.3)
Chloride: 105 mmol/L (ref 98–111)
Creatinine, Ser: 0.85 mg/dL (ref 0.44–1.00)
GFR, Estimated: >60 mL/min (ref >60)
Glucose, Bld: 62 mg/dL — ABNORMAL LOW (ref 70–99)
Potassium: 4.2 mmol/L (ref 3.5–5.1)
Sodium: 142 mmol/L (ref 135–145)
Total Bilirubin: 0.3 mg/dL (ref 0.0–1.2)
Total Protein: 7.7 g/dL (ref 6.5–8.1)

## 2023-07-28 LAB — URINALYSIS, ROUTINE W REFLEX MICROSCOPIC
Bilirubin Urine: NEGATIVE
Glucose, UA: NEGATIVE mg/dL
Ketones, ur: NEGATIVE mg/dL
Nitrite: NEGATIVE
Protein, ur: 30 mg/dL — AB
Specific Gravity, Urine: 1.032 — ABNORMAL HIGH (ref 1.005–1.030)
pH: 6.5 (ref 5.0–8.0)

## 2023-07-28 LAB — CBC WITH DIFFERENTIAL/PLATELET
Abs Immature Granulocytes: 0.01 10*3/uL (ref 0.00–0.07)
Basophils Absolute: 0 10*3/uL (ref 0.0–0.1)
Basophils Relative: 1 %
Eosinophils Absolute: 0.1 10*3/uL (ref 0.0–0.5)
Eosinophils Relative: 2 %
HCT: 38.2 % (ref 36.0–46.0)
Hemoglobin: 12.5 g/dL (ref 12.0–15.0)
Immature Granulocytes: 0 %
Lymphocytes Relative: 33 %
Lymphs Abs: 2.1 10*3/uL (ref 0.7–4.0)
MCH: 29.8 pg (ref 26.0–34.0)
MCHC: 32.7 g/dL (ref 30.0–36.0)
MCV: 91 fL (ref 80.0–100.0)
Monocytes Absolute: 0.6 10*3/uL (ref 0.1–1.0)
Monocytes Relative: 10 %
Neutro Abs: 3.3 10*3/uL (ref 1.7–7.7)
Neutrophils Relative %: 54 %
Platelets: 313 10*3/uL (ref 150–400)
RBC: 4.2 MIL/uL (ref 3.87–5.11)
RDW: 13 % (ref 11.5–15.5)
WBC: 6.2 10*3/uL (ref 4.0–10.5)
nRBC: 0 % (ref 0.0–0.2)

## 2023-07-28 LAB — HCG, SERUM, QUALITATIVE: Preg, Serum: NEGATIVE

## 2023-07-28 MED ORDER — DOXYCYCLINE HYCLATE 100 MG PO CAPS: 100.0000 mg | ORAL_CAPSULE | Freq: Two times a day (BID) | ORAL | 0 refills | Status: AC

## 2023-07-28 MED ORDER — CEPHALEXIN 500 MG PO CAPS: 500.0000 mg | ORAL_CAPSULE | Freq: Two times a day (BID) | ORAL | 0 refills | Status: AC

## 2023-07-28 MED ORDER — KETOROLAC TROMETHAMINE 15 MG/ML IJ SOLN
15.0000 mg | Freq: Once | INTRAMUSCULAR | Status: AC
  Administered 2023-07-28: 15 mg via INTRAVENOUS
  Filled 2023-07-28: qty 1

## 2023-07-28 NOTE — Discharge Instructions (Addendum)
 You are seen today for urinary tract infection as well as for wound check of your surgical wound.  Your wound does have some slight redness around the surgical site, we will provide by a antibiotic for you to take twice a day for the next 7 days for this.  As well as provide no antibiotic for the UTI for the next 5 days.  Please take until completion as well as follow-up with the surgeon for a follow-up check.  We are providing you with some new bandages, be sure to continue to change bandages and monitor wound.  If you cannot have any fever or any new or worsening symptoms, return to the ED for further evaluation.  Continue to manage pain with Tylenol  and ibuprofen.

## 2023-07-28 NOTE — ED Notes (Signed)
Pt wound cleaned and dressed

## 2023-07-28 NOTE — ED Triage Notes (Signed)
 Patient to ED reporting a left leg infection after drug injections last month. Patient has finished PO antibiotics and concerned wound is not healed appropriately. Slight redness around silver dollar sized wound bed.

## 2023-07-28 NOTE — ED Provider Notes (Signed)
 [place] EMERGENCY DEPARTMENT AT [facility] Provider Note   CSN: [id number] Arrival date & time: 07/28/23  1830     History  Chief Complaint  Patient presents with   Wound Infection    Gabrielle Black is a 33 y.o. female.  HPI Patient is a 33 year old female presents ED today with complaints of left lower leg wound that she believes is getting more painful after she had a irrigation and debridement.  She has been cleaning and changing dressings regularly.  Notes that she had missed her recent appointment with her surgeon earlier this week "due to not being able to make it."  States she also believes she may have a UTI as her urine smell has been off and has been similar to previous times she has had a UTI.  Denies fever, vision changes, headache, chest pain, shortness of breath, abdominal pain, nausea, vomiting, diarrhea, numbness, weakness, tingling.    Home Medications Prior to Admission medications   Medication Sig Start Date End Date Taking? Authorizing Provider  cephALEXin  (KEFLEX ) 500 MG capsule Take 1 capsule (500 mg total) by mouth 2 (two) times daily for 5 days. 07/28/23 08/02/23 Yes Ariyon Mittleman S, PA-C  doxycycline  (VIBRAMYCIN ) 100 MG capsule Take 1 capsule (100 mg total) by mouth 2 (two) times daily for 7 days. 07/28/23 08/04/23 Yes Kamber Vignola S, PA-C  acetaminophen  (TYLENOL ) 325 MG tablet Take 650 mg by mouth daily as needed for mild pain (pain score 1-3) or moderate pain (pain score 4-6).    [provider]  mupirocin ointment (BACTROBAN) 2 % Apply 1 Application topically every other day. 06/08/23   [provider]  naloxone  (NARCAN ) nasal spray 4 mg/0.1 mL Place 0.4 mg into the nose once. 06/15/21   [provider]      Allergies    Patient has no known allergies.    Review of Systems   Review of Systems  Skin:  Positive for wound.  All other systems reviewed and are negative.   Physical Exam Updated Vital Signs BP 130/81 (BP  Location: Right Arm)   Pulse 98   Temp 98.6 F (37 C) (Temporal)   Resp 14   SpO2 99%  Physical Exam Vitals and nursing note reviewed.  Constitutional:      General: She is not in acute distress.    Appearance: Normal appearance. She is not ill-appearing, toxic-appearing or diaphoretic.  HENT:     Head: Normocephalic and atraumatic.     Mouth/Throat:     Mouth: Mucous membranes are moist.     Pharynx: Oropharynx is clear. No oropharyngeal exudate or posterior oropharyngeal erythema.  Eyes:     General: No scleral icterus.       Right eye: No discharge.        Left eye: No discharge.     Extraocular Movements: Extraocular movements intact.     Conjunctiva/sclera: Conjunctivae normal.  Cardiovascular:     Rate and Rhythm: Normal rate and regular rhythm.     Pulses: Normal pulses.     Heart sounds: Normal heart sounds. No murmur heard.    No friction rub. No gallop.  Pulmonary:     Effort: Pulmonary effort is normal. No respiratory distress.     Breath sounds: Normal breath sounds. No stridor. No wheezing, rhonchi or rales.  Abdominal:     General: Abdomen is flat. There is no distension.     Palpations: Abdomen is soft.     Tenderness: There is no  abdominal tenderness. There is no guarding.  Musculoskeletal:        General: Tenderness (Tenderness around surgical site) present.     Right lower leg: Edema present.     Left lower leg: Edema present.  Skin:    General: Skin is warm and dry.     Findings: Erythema (Mild erythematous ring around surgical site) and lesion (Well-healing surgical wound noted to the left lower leg, picture in chart) present. No bruising.  Neurological:     General: No focal deficit present.     Mental Status: She is alert and oriented to person, place, and time. Mental status is at baseline.     Sensory: No sensory deficit.     Motor: No weakness.     Gait: Gait normal.  Psychiatric:        Mood and Affect: Mood normal.     ED Results /  Procedures / Treatments   Labs (all labs ordered are listed, but only abnormal results are displayed) Labs Reviewed  COMPREHENSIVE METABOLIC PANEL WITH GFR - Abnormal; Notable for the following components:      Result Value   Glucose, Bld 62 (*)    All other components within normal limits  URINALYSIS, ROUTINE W REFLEX MICROSCOPIC - Abnormal; Notable for the following components:   APPearance HAZY (*)    Specific Gravity, Urine 1.032 (*)    Hgb urine dipstick SMALL (*)    Protein, ur 30 (*)    Leukocytes,Ua SMALL (*)    Bacteria, UA MANY (*)    All other components within normal limits  CBC WITH DIFFERENTIAL/PLATELET  HCG, SERUM, QUALITATIVE    EKG None  Radiology No results found.  Procedures Procedures    Medications Ordered in ED Medications  ketorolac (TORADOL) 15 MG/ML injection 15 mg (15 mg Intravenous Given 07/28/23 2049)    ED Course/ Medical Decision Making/ A&P                                 Medical Decision Making Amount and/or Complexity of Data Reviewed Labs: ordered.  Risk Prescription drug management.   This patient is a 33 year old female who presents to the ED for concern of wound check and possible UTI.  Patient has missed her appoint with her surgeon for follow-up visit.  Has been taking good care of her wound with regular bandage changes and cleanings.  Said that she has mild pain at this time around the surgical site and is also suspicious for UTI.  Denies any new injections of IVD.  On physical exam, patient is in no acute distress, afebrile, alert and orient x 4, speaking in full sentences, nontachypneic, nontachycardic.  Picture of lesion noted in chart, noted to have a mildly erythematous and tender ring around the surgical site, suspicious for cellulitis.  Patient also has notably bilateral lower leg edema, that is dependent.  With left leg having no edema noted when raised but having right leg off the bed was edematous.  When both are up,  swelling goes away.  She has not used compression stockings.  Exam is otherwise unremarkable.  UA did show possible UTI with many bacteria noted.  Will prescribe Keflex  for this.  Case was discussed with attending Dr. Nolia Baumgartner who wished to have patient placed on doxycycline  until she can be seen by her surgeon.  Will redress the wound and provide cleaning supplies.  Will also have her follow-up  with her plastic surgeon, which she says she has all information needed to contact his office.  Low suspicion for any other abnormality at this time and suspect the edema is most likely dependent edema as she is on her feet much of the day according to her.  Will continue to have her use compression stockings and will have some those sent in as well.  Patient vital signs have remained stable throughout the course of patient's time in the ED. Low suspicion for any other emergent pathology at this time. I believe this patient is safe to be discharged. Provided strict return to ER precautions. Patient expressed agreement and understanding of plan. All questions were answered.  Differential diagnoses prior to evaluation: The emergent differential diagnosis includes, but is not limited to, cellulitis, abscess, osteomyelitis, dermatitis, folliculitis, gangrene, necrotizing fasciitis, wound associated tightness. This is not an exhaustive differential.   Past Medical History / Co-morbidities / Social History: IV drug user, ADHD, HSV-2  Additional history: Chart reviewed. Pertinent results include:   Patient notably admitted on 06/27/2023 for necrotic leg wound secondary to IV injection.  Treated with vancomycin  and cefepime .  MRI was ordered at that time did not show any evidence of abscess.  Plastic surgery did debride and irrigate wound.  Underwent irrigation and debridement on 07/02/2023.  Lab Tests/Imaging studies: I personally interpreted labs/imaging and the pertinent results include:    .  CBC  unremarkable CMP unremarkable hCG qualitative negative UA does show leukocytes and many bacteria  No urgent need at this time, considered x-ray or CT imaging to evaluate for osteo or low suspicion for this at this time.  Medications: I ordered medication including Toradol.  I have reviewed the patients home medicines and have made adjustments as needed.  Critical Interventions: None  Social Determinants of Health: Homeless, IVD user  Disposition: After consideration of the diagnostic results and the patients response to treatment, I feel that the patient would benefit from discharge and treatment as above.   emergency department workup does not suggest an emergent condition requiring admission or immediate intervention beyond what has been performed at this time. The plan is: Follow-up with plastic surgeon, return for any new or worsened symptoms, Keflex  for UTI, doxycycline  for possible underlying cellulitis, return for new or worsening symptoms. The patient is safe for discharge and has been instructed to return immediately for worsening symptoms, change in symptoms or any other concerns.   Final Clinical Impression(s) / ED Diagnoses Final diagnoses:  Encounter for post surgical wound check  Urinary tract infection without hematuria, site unspecified    Rx / DC Orders ED Discharge Orders          Ordered    doxycycline  (VIBRAMYCIN ) 100 MG capsule  2 times daily        07/28/23 2311    cephALEXin  (KEFLEX ) 500 MG capsule  2 times daily        07/28/23 2311    Compression stockings        07/28/23 2315              Hayes Lipps, PA-C 07/28/23 2321    Hershel Los, MD 07/29/23 2102

## 2023-08-06 ENCOUNTER — Ambulatory Visit: Payer: MEDICAID | Admitting: Surgical

## 2023-08-06 DIAGNOSIS — S81802D Unspecified open wound, left lower leg, subsequent encounter: Secondary | ICD-10-CM

## 2023-08-06 NOTE — Progress Notes (Signed)
   Referring Provider No referring provider defined for this encounter.   CC: No chief complaint on file.     Gabrielle Black is an 33 y.o. female.  HPI: Patient is a 33 year old female here for follow-up on her left leg wound.  She was initially admitted to the hospital on 06/27/2023 for cellulitis of her left lower extremity wound.  The wound was necrotic and had a thickened eschar.  She underwent debridement with Dr. Carolynne Citron on 07/02/2023.  She did not follow-up in the clinic after that.  She was recently seen in the emergency room again on 07/28/2023 for evaluation of the wound and possible UTI.  She was prescribed Keflex  for UTI and doxycycline  for the wound.  She reports today that she has been using Adaptic, K-Y jelly and a wound wash, covering this with Kerlix and Ace wrap.  She is not having any infectious symptoms.  Review of Systems General: No chills  Physical Exam    07/28/2023   11:40 PM 07/28/2023    6:51 PM 07/03/2023    5:11 AM  Vitals with BMI  Systolic 122 130 147  Diastolic 74 81 64  Pulse 74 98 61    General:  No acute distress,  Alert and oriented, Non-Toxic, Normal speech and affect Left lower extremity: Left lower extremity wound with significant amount of granulation tissue noted, periwound area does not appear infected or concerning.  There is no foul odors.  There is no active drainage noted.  No surrounding tenderness or crepitus is noted.  Wound is approximately 3 x 3 cm.    Assessment/Plan Recommend continuing with Adaptic, K-Y jelly, 4 x 4 gauze, Kerlix, Ace wrap dressing changes daily.  Recommend cleansing the area daily with Vashe soaked gauze.  There is no signs of infection or concern on exam.  Recommend following up in 3 weeks for reevaluation. Pictures were obtained of the patient and placed in the chart with the patient's or guardian's permission.  Discussed with patient if she develops any worsening symptoms, infectious symptoms that she needs to be  seen in our office sooner than 2 to 3 weeks.   Gabrielle Black 08/06/2023, 11:59 AM

## 2023-08-27 ENCOUNTER — Ambulatory Visit: Payer: MEDICAID | Admitting: Surgical

## 2023-09-19 ENCOUNTER — Encounter (HOSPITAL_BASED_OUTPATIENT_CLINIC_OR_DEPARTMENT_OTHER): Payer: Self-pay | Admitting: Emergency Medicine

## 2023-09-19 ENCOUNTER — Other Ambulatory Visit: Payer: Self-pay

## 2023-09-19 ENCOUNTER — Emergency Department (HOSPITAL_BASED_OUTPATIENT_CLINIC_OR_DEPARTMENT_OTHER)
Admission: EM | Admit: 2023-09-19 | Discharge: 2023-09-20 | Disposition: A | Discharge status: Home / Self Care | Payer: MEDICAID | Attending: Emergency Medicine | Admitting: Emergency Medicine

## 2023-09-19 DIAGNOSIS — Z87898 Personal history of other specified conditions: Secondary | ICD-10-CM | POA: Insufficient documentation

## 2023-09-19 DIAGNOSIS — L03115 Cellulitis of right lower limb: Secondary | ICD-10-CM | POA: Insufficient documentation

## 2023-09-19 DIAGNOSIS — N3000 Acute cystitis without hematuria: Secondary | ICD-10-CM | POA: Diagnosis not present

## 2023-09-19 DIAGNOSIS — F1990 Other psychoactive substance use, unspecified, uncomplicated: Secondary | ICD-10-CM

## 2023-09-19 DIAGNOSIS — M79671 Pain in right foot: Secondary | ICD-10-CM | POA: Diagnosis present

## 2023-09-19 LAB — URINALYSIS, W/ REFLEX TO CULTURE (INFECTION SUSPECTED)
Bilirubin Urine: NEGATIVE
Glucose, UA: NEGATIVE mg/dL
Ketones, ur: NEGATIVE mg/dL
Nitrite: POSITIVE — AB
Protein, ur: 30 mg/dL — AB
Specific Gravity, Urine: 1.021 (ref 1.005–1.030)
WBC, UA: >50 WBC/hpf (ref 0–5)
pH: 6 (ref 5.0–8.0)

## 2023-09-19 MED ORDER — SODIUM CHLORIDE 0.9 % IV SOLN
1.0000 g | Freq: Once | INTRAVENOUS | Status: AC
  Administered 2023-09-20: 1 g via INTRAVENOUS
  Filled 2023-09-19: qty 10

## 2023-09-19 NOTE — ED Triage Notes (Signed)
 Wound left shin, right foot, various scabs on legs. Suspects UTI-low back pain or yeast infection-discharge. Admits to IV drug use. Denies N/V, CP, SOB.

## 2023-09-20 LAB — CBC WITH DIFFERENTIAL/PLATELET
Abs Immature Granulocytes: 0.02 K/uL (ref 0.00–0.07)
Basophils Absolute: 0 K/uL (ref 0.0–0.1)
Basophils Relative: 0 %
Eosinophils Absolute: 0.3 K/uL (ref 0.0–0.5)
Eosinophils Relative: 3 %
HCT: 32.2 % — ABNORMAL LOW (ref 36.0–46.0)
Hemoglobin: 10.5 g/dL — ABNORMAL LOW (ref 12.0–15.0)
Immature Granulocytes: 0 %
Lymphocytes Relative: 23 %
Lymphs Abs: 2.2 K/uL (ref 0.7–4.0)
MCH: 30.1 pg (ref 26.0–34.0)
MCHC: 32.6 g/dL (ref 30.0–36.0)
MCV: 92.3 fL (ref 80.0–100.0)
Monocytes Absolute: 0.7 K/uL (ref 0.1–1.0)
Monocytes Relative: 7 %
Neutro Abs: 6.5 K/uL (ref 1.7–7.7)
Neutrophils Relative %: 67 %
Platelets: 237 K/uL (ref 150–400)
RBC: 3.49 MIL/uL — ABNORMAL LOW (ref 3.87–5.11)
RDW: 13.4 % (ref 11.5–15.5)
WBC: 9.8 K/uL (ref 4.0–10.5)
nRBC: 0 % (ref 0.0–0.2)

## 2023-09-20 LAB — BASIC METABOLIC PANEL WITH GFR
Anion gap: 11 (ref 5–15)
BUN: 8 mg/dL (ref 6–20)
CO2: 23 mmol/L (ref 22–32)
Calcium: 8.8 mg/dL — ABNORMAL LOW (ref 8.9–10.3)
Chloride: 104 mmol/L (ref 98–111)
Creatinine, Ser: 0.88 mg/dL (ref 0.44–1.00)
GFR, Estimated: >60 mL/min
Glucose, Bld: 80 mg/dL (ref 70–99)
Potassium: 3.9 mmol/L (ref 3.5–5.1)
Sodium: 138 mmol/L (ref 135–145)

## 2023-09-20 LAB — GC/CHLAMYDIA PROBE AMP (~~LOC~~) NOT AT ARMC
Chlamydia: NEGATIVE
Comment: NEGATIVE
Comment: NORMAL
Neisseria Gonorrhea: NEGATIVE

## 2023-09-20 MED ORDER — DOXYCYCLINE HYCLATE 100 MG PO CAPS
100.0000 mg | ORAL_CAPSULE | Freq: Two times a day (BID) | ORAL | 0 refills | Status: AC
Start: 2023-09-20 — End: ?

## 2023-09-20 MED ORDER — CEPHALEXIN 500 MG PO CAPS: 500.0000 mg | ORAL_CAPSULE | Freq: Three times a day (TID) | ORAL | 0 refills | Status: AC

## 2023-09-20 MED ORDER — DOXYCYCLINE HYCLATE 100 MG PO TABS
100.0000 mg | ORAL_TABLET | Freq: Once | ORAL | Status: AC
  Administered 2023-09-20: 100 mg via ORAL
  Filled 2023-09-20: qty 1

## 2023-09-20 NOTE — ED Notes (Signed)
 Unsuccessful IV insertion/blood draw attempt x3 d/t pt being a difficult stick. IV trained RN to attempt when available.

## 2023-09-20 NOTE — ED Provider Notes (Signed)
 [place] EMERGENCY DEPARTMENT AT [facility] Provider Note   CSN: [id number] Arrival date & time: 09/19/23  2246     Patient presents with: Wound Check   Gabrielle Black is a 33 y.o. female.   HPI     This is a 33 year old female who presents with multiple complaints.  History of IV drug use.  Reports increasing redness and pain to the right foot in various scabs of the leg.  History of wound on the left leg requiring debridement.  Also reporting some low back discomfort and dysuria.  She is concerned she may have a UTI.  No noted fevers.  She does continue to use IV drugs and last shot up approximately 8 hours prior to arrival.  She does inject in her feet.  Prior to Admission medications   Medication Sig Start Date End Date Taking? Authorizing Provider  cephALEXin  (KEFLEX ) 500 MG capsule Take 1 capsule (500 mg total) by mouth 3 (three) times daily. 09/20/23  Yes Kason Benak, Charmaine FALCON, MD  doxycycline  (VIBRAMYCIN ) 100 MG capsule Take 1 capsule (100 mg total) by mouth 2 (two) times daily. 09/20/23  Yes Verdelle Valtierra, Charmaine FALCON, MD  acetaminophen  (TYLENOL ) 325 MG tablet Take 650 mg by mouth daily as needed for mild pain (pain score 1-3) or moderate pain (pain score 4-6).    [provider]  mupirocin ointment (BACTROBAN) 2 % Apply 1 Application topically every other day. 06/08/23   [provider]  naloxone  (NARCAN ) nasal spray 4 mg/0.1 mL Place 0.4 mg into the nose once. 06/15/21   [provider]    Allergies: Patient has no known allergies.    Review of Systems  Constitutional:  Negative for fever.  Respiratory:  Negative for shortness of breath.   Cardiovascular:  Negative for chest pain.  Gastrointestinal:  Negative for abdominal pain.  Genitourinary:  Positive for dysuria.  Skin:  Positive for color change, rash and wound.  All other systems reviewed and are negative.   Updated Vital Signs BP 116/60   Pulse 98   Temp 99.1 F (37.3 C)   Resp 18    Wt 57.6 kg   SpO2 100%   BMI 21.80 kg/m   Physical Exam Vitals and nursing note reviewed.  Constitutional:      Appearance: She is well-developed. She is not ill-appearing.  HENT:     Head: Normocephalic and atraumatic.  Eyes:     Pupils: Pupils are equal, round, and reactive to light.  Cardiovascular:     Rate and Rhythm: Normal rate and regular rhythm.     Heart sounds: Normal heart sounds.  Pulmonary:     Effort: Pulmonary effort is normal. No respiratory distress.     Breath sounds: No wheezing.  Abdominal:     Palpations: Abdomen is soft.  Musculoskeletal:     Cervical back: Neck supple.  Skin:    General: Skin is warm and dry.     Comments: Multiple track marks noted with wounds in multiple stages of healing, there is a wound on the left shin with some moderate drainage, no adjacent erythema, right foot erythematous and slightly swollen, 2+ DP pulse  Neurological:     Mental Status: She is alert and oriented to person, place, and time.  Psychiatric:        Mood and Affect: Mood normal.     (all labs ordered are listed, but only abnormal results are displayed) Labs Reviewed  CBC WITH DIFFERENTIAL/PLATELET - Abnormal; Notable for the  following components:      Result Value   RBC 3.49 (*)    Hemoglobin 10.5 (*)    HCT 32.2 (*)    All other components within normal limits  BASIC METABOLIC PANEL WITH GFR - Abnormal; Notable for the following components:   Calcium 8.8 (*)    All other components within normal limits  URINALYSIS, W/ REFLEX TO CULTURE (INFECTION SUSPECTED) - Abnormal; Notable for the following components:   APPearance HAZY (*)    Hgb urine dipstick SMALL (*)    Protein, ur 30 (*)    Nitrite POSITIVE (*)    Leukocytes,Ua LARGE (*)    Bacteria, UA FEW (*)    Crystals PRESENT (*)    All other components within normal limits  CULTURE, BLOOD (ROUTINE X 2)  CULTURE, BLOOD (ROUTINE X 2)  URINE CULTURE  GC/CHLAMYDIA PROBE AMP ([facility]) NOT AT [facility]     EKG: None  Radiology: No results found.   Procedures   Medications Ordered in the ED  cefTRIAXone  (ROCEPHIN ) 1 g in sodium chloride  0.9 % 100 mL IVPB (0 g Intravenous Stopped 09/20/23 0136)  doxycycline  (VIBRA -TABS) tablet 100 mg (100 mg Oral Given 09/20/23 0140)                                    Medical Decision Making Amount and/or Complexity of Data Reviewed Labs: ordered.  Risk Prescription drug management.   This patient presents to the ED for concern of infection, UTI, this involves an extensive number of treatment options, and is a complaint that carries with it a high risk of complications and morbidity.  I considered the following differential and admission for this acute, potentially life threatening condition.  The differential diagnosis includes UTI, cellulitis, abscess  MDM:    This is a 33 year old female who presents with potential infection of the right foot as well as urinary tract symptoms.  She is nontoxic.  Vital signs reassuring.  Afebrile.  She is at high risk for cellulitis given IV drug use history.  Clinical exam is consistent with cellulitis.  She also has multiple track marks and wounds in various stages of healing on the bilateral lower extremities.  Blood cultures obtained.  Basic lab work obtained.  No leukocytosis.  No metabolic derangements.  Urinalysis is consistent with UTI.  Patient was given IV Rocephin  which would cover both UTI and cellulitis.  Given that she is afebrile and has no leukocytosis, feel that she can be treated as an outpatient.  Feel it is prudent to cover for MRSA as well.  For this reason will be given Keflex  for UTI and doxycycline  for cellulitis.  Discussed this with the patient.  She was given strict return precautions.  (Labs, imaging, consults)  Labs: I Ordered, and personally interpreted labs.  The pertinent results include: CBC, BMP, blood culture, urinalysis  Imaging Studies ordered: I ordered imaging studies  including none I independently visualized and interpreted imaging. I agree with the radiologist interpretation  Additional history obtained from chart review.  External records from outside source obtained and reviewed including prior evaluations  Cardiac Monitoring: The patient was not maintained on a cardiac monitor.  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    Dispense:  20 capsule    Refill:  0   cephALEXin  (KEFLEX ) 500 MG capsule    Sig: Take 1 capsule (500 mg total) by mouth 3 (three) times daily.    Dispense:  21 capsule    Refill:  0    I have reviewed the patients home medicines and have made adjustments as needed  Problem List / ED Course: Problem List Items Addressed This Visit       Other   Cellulitis   Other Visit Diagnoses       Acute cystitis without hematuria    -  Primary     IVDU (intravenous drug user)                    Final diagnoses:  Acute cystitis without hematuria  Cellulitis of right lower extremity  IVDU (intravenous drug user)    ED Discharge Orders          Ordered    doxycycline   (VIBRAMYCIN ) 100 MG capsule  2 times daily        09/20/23 0147    cephALEXin  (KEFLEX ) 500 MG capsule  3 times daily        09/20/23 0147               Bari Charmaine FALCON, MD 09/20/23 [id number]

## 2023-09-20 NOTE — Discharge Instructions (Addendum)
 You were seen today for several complaints.  You do have evidence of urinary tract infection and likely have cellulitis related to your IV drug use.  It is very important that you take your medications as prescribed.  If you develop streaking redness, fevers, any new or worsening symptoms, you should be reevaluated.

## 2023-09-22 LAB — URINE CULTURE: Culture: >=100000 — AB

## 2023-09-23 ENCOUNTER — Telehealth (HOSPITAL_BASED_OUTPATIENT_CLINIC_OR_DEPARTMENT_OTHER): Payer: Self-pay | Admitting: *Deleted

## 2023-09-23 NOTE — Telephone Encounter (Signed)
 Post ED Visit - Positive Culture Follow-up  Culture report reviewed by antimicrobial stewardship pharmacist: [facility] []  Rankin Dee, Pharm.D. []  Venetia Gully, Pharm.D., BCPS AQ-ID []  Garrel Crews, Pharm.D., BCPS []  Almarie Lunger, [address].D., BCPS []  [place], [place].D., BCPS, AAHIVP []  Rosaline Bihari, Pharm.D., BCPS, AAHIVP []  Vernell Meier, PharmD, BCPS []  Latanya Hint, PharmD, BCPS []  Donald Medley, PharmD, BCPS []  Rocky Bold, PharmD []  Dorothyann Alert, PharmD, BCPS [x] Dorn Buttner, PharmD  [facility] []  Rosaline Edison, PharmD []  Romona Bliss, PharmD []  Dolphus Roller, PharmD []  Veva Seip, Rph []  Vernell Daunt) Leonce, PharmD []  Eva Allis, PharmD []  Rosaline Millet, PharmD []  Iantha Batch, PharmD []  Arvin Gauss, PharmD []  Wanda Hasting, PharmD []  Ronal Rav, PharmD []  Rocky Slade, PharmD []  Bard Jeans, PharmD   Positive urine culture Treated with Cephalexin , organism sensitive to the same and no further patient follow-up is required at this time.  Gabrielle Black 09/23/2023, 1:36 PM

## 2023-09-25 LAB — CULTURE, BLOOD (ROUTINE X 2)
Culture: NO GROWTH
Culture: NO GROWTH
